# Patient Record
Sex: Male | Born: 1970 | Hispanic: No | Marital: Married | Smoking: Light tobacco smoker
Health system: Southern US, Community
[De-identification: ages and names within clinical notes are randomized; demographics above are authoritative.]

## PROBLEM LIST (undated history)

## (undated) DIAGNOSIS — G4733 Obstructive sleep apnea (adult) (pediatric): Secondary | ICD-10-CM

## (undated) DIAGNOSIS — R740 Nonspecific elevation of levels of transaminase and lactic acid dehydrogenase [LDH]: Secondary | ICD-10-CM

## (undated) DIAGNOSIS — E119 Type 2 diabetes mellitus without complications: Secondary | ICD-10-CM

## (undated) DIAGNOSIS — Z9989 Dependence on other enabling machines and devices: Secondary | ICD-10-CM

## (undated) DIAGNOSIS — E785 Hyperlipidemia, unspecified: Secondary | ICD-10-CM

## (undated) DIAGNOSIS — R790 Abnormal level of blood mineral: Secondary | ICD-10-CM

## (undated) HISTORY — DX: Hyperlipidemia, unspecified: E78.5

## (undated) HISTORY — DX: Nonspecific elevation of levels of transaminase and lactic acid dehydrogenase (ldh): R74.0

## (undated) HISTORY — PX: NO PAST SURGERIES: SHX2092

## (undated) HISTORY — DX: Obstructive sleep apnea (adult) (pediatric): G47.33

## (undated) HISTORY — DX: Type 2 diabetes mellitus without complications: E11.9

## (undated) HISTORY — DX: Abnormal level of blood mineral: R79.0

## (undated) HISTORY — DX: Dependence on other enabling machines and devices: Z99.89

---

## 2013-02-10 ENCOUNTER — Ambulatory Visit (INDEPENDENT_AMBULATORY_CARE_PROVIDER_SITE_OTHER): Payer: BC Managed Care – PPO | Admitting: Family Medicine

## 2013-02-10 ENCOUNTER — Encounter: Payer: Self-pay | Admitting: Family Medicine

## 2013-02-10 ENCOUNTER — Encounter (INDEPENDENT_AMBULATORY_CARE_PROVIDER_SITE_OTHER): Payer: Self-pay

## 2013-02-10 VITALS — BP 108/76 | Temp 98.3°F | Ht 72.05 in | Wt 221.0 lb

## 2013-02-10 DIAGNOSIS — Z23 Encounter for immunization: Secondary | ICD-10-CM

## 2013-02-10 DIAGNOSIS — E789 Disorder of lipoprotein metabolism, unspecified: Secondary | ICD-10-CM

## 2013-02-10 DIAGNOSIS — G4733 Obstructive sleep apnea (adult) (pediatric): Secondary | ICD-10-CM

## 2013-02-10 DIAGNOSIS — E119 Type 2 diabetes mellitus without complications: Secondary | ICD-10-CM

## 2013-02-10 LAB — LIPID PANEL
CHOL/HDL RATIO: 7
Cholesterol: 176 mg/dL (ref 0–200)
HDL: 24.9 mg/dL — ABNORMAL LOW (ref >39.00)
TRIGLYCERIDES: 870 mg/dL — AB (ref 0.0–149.0)
VLDL: 174 mg/dL — AB (ref 0.0–40.0)

## 2013-02-10 LAB — BASIC METABOLIC PANEL
BUN: 14 mg/dL (ref 6–23)
CO2: 29 mEq/L (ref 19–32)
Calcium: 9.1 mg/dL (ref 8.4–10.5)
Chloride: 99 mEq/L (ref 96–112)
Creatinine, Ser: 1.1 mg/dL (ref 0.4–1.5)
GFR: 74.63 mL/min (ref >60.00)
Glucose, Bld: 391 mg/dL — ABNORMAL HIGH (ref 70–99)
Potassium: 4.3 mEq/L (ref 3.5–5.1)
Sodium: 135 mEq/L (ref 135–145)

## 2013-02-10 LAB — MICROALBUMIN / CREATININE URINE RATIO
Creatinine,U: 108.3 mg/dL
MICROALB UR: 5.5 mg/dL — AB (ref 0.0–1.9)
Microalb Creat Ratio: 5.1 mg/g (ref 0.0–30.0)

## 2013-02-10 LAB — HEMOGLOBIN A1C: Hgb A1c MFr Bld: 9.4 % — ABNORMAL HIGH (ref 4.6–6.5)

## 2013-02-10 LAB — LDL CHOLESTEROL, DIRECT: Direct LDL: 43.5 mg/dL

## 2013-02-10 NOTE — Progress Notes (Signed)
Chief Complaint  Patient presents with  . Establish Care    HPI:  David Grant is here to establish care. Recently moved to [place].  Last PCP and physical:   Has the following chronic problems and concerns today:  OSA: -uses CPAP -has not had sleep study in over 13 years and CPAP mask is falling apart and needs new mask -report this machine is only 45 years old   Diabetes Mellitis: -reports was on metformin but this was stopped due to good control and was diet controlled after -last labs - he does not remember -he has not checked his blood sugar in over 1 year  HLD: -never on medication for this  No regular exercise  Feels like diet is good  Patient Active Problem List   Diagnosis Date Noted  . Obstructive sleep apnea 02/10/2013  . Diabetes mellitus, type 2 02/10/2013  . Lipid disorder 02/10/2013    Health Maintenance: -had flu vaccine in September -never had pneumonia -does not remember last tdap  ROS: See pertinent positives and negatives per HPI.  Past Medical History  Diagnosis Date  . Diabetes mellitus without complication   . Hyperlipidemia   . OSA on CPAP     Family History  Problem Relation Age of Onset  . Cancer Father   . Diabetes Father   . Hypertension Father     History   Social History  . Marital Status: Married    Spouse Name: N/A    Number of Children: N/A  . Years of Education: N/A   Social History Main Topics  . Smoking status: Light Tobacco Smoker  . Smokeless tobacco: None     Comment:  1-2 cig per week  . Alcohol Use: Yes     Comment: rarely  . Drug Use: No  . Sexual Activity: None   Other Topics Concern  . 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  EXAMCeasar Mons Vitals:   02/10/13 0928  BP:  108/76  Temp: 98.3 F (36.8 C)    Body mass index is 29.93 kg/(m^2).  GENERAL: vitals reviewed and listed above, alert, oriented, appears well hydrated and in no acute distress  HEENT: atraumatic, conjunttiva clear, no obvious abnormalities on inspection of external nose and ears  NECK: no obvious masses on inspection  LUNGS: clear to auscultation bilaterally, no wheezes, rales or rhonchi, good air movement  CV: HRRR, no peripheral edema  MS: moves all extremities without noticeable abnormality  PSYCH: pleasant and cooperative, no obvious depression or anxiety  ASSESSMENT AND PLAN:  Discussed the following assessment and plan:  Diabetes mellitus, type 2 - Plan: Hemoglobin A1c, Basic metabolic panel, Microalbumin/Creatinine Ratio, Urine  Lipid disorder - Plan: Lipid Panel  Obstructive sleep apnea - Plan: Nocturnal polysomnography (NPSG)  Need for prophylactic vaccination with combined diphtheria-tetanus-pertussis (DTP) vaccine - Plan: Tdap vaccine greater than or equal to 7yo IM  Need for prophylactic vaccination and inoculation against varicella - Plan: Pneumococcal polysaccharide vaccine 23-valent greater than or equal to 2yo subcutaneous/IM, Pneumococcal polysaccharide vaccine 23-valent greater than or equal to 2yo subcutaneous/IM  -We reviewed the PMH, PSH, FH, SH, Meds and Allergies. -We provided refills for any  medications we will prescribe as needed. -We addressed current concerns per orders and patient instructions. -We have asked for records for pertinent exams, studies, vaccines and notes from previous providers. -We have advised patient to follow up per instructions below. -NON-FASTING labs today -tdap and pneumvax today -aksed him to have cpap company fax us orders for new mask and advised sleep study to reeval but he refused reeval at this time -advised healthy diet, no smoking, regular exercise -follow up 1 month   -Patient advised to return or notify a  doctor immediately if symptoms worsen or persist or new concerns arise.  Patient Instructions  -We have ordered labs or studies at this visit. It can take up to 1-2 weeks for results and processing. We will contact you with instructions IF your results are abnormal. Normal results will be released to your [facility]. If you have not heard from us or can not find your results in [facility] in 2 weeks please contact our office.  -PLEASE SIGN UP FOR MYCHART TODAY   We recommend the following healthy lifestyle measures: - eat a healthy diet consisting of lots of vegetables, fruits, beans, nuts, seeds, healthy meats such as white chicken and fish and whole grains.  - avoid fried foods, fast food, processed foods, sodas, red meet and other fattening foods.  - get a least 150 minutes of aerobic exercise per week.   Follow up in: 1 month      Mullany, HANNAH R.

## 2013-02-10 NOTE — Patient Instructions (Signed)
-  We have ordered labs or studies at this visit. It can take up to 1-2 weeks for results and processing. We will contact you with instructions IF your results are abnormal. Normal results will be released to your MYCHART. If you have not heard from us or can not find your results in MYCHART in 2 weeks please contact our office.  -PLEASE SIGN UP FOR MYCHART TODAY   We recommend the following healthy lifestyle measures: - eat a healthy diet consisting of lots of vegetables, fruits, beans, nuts, seeds, healthy meats such as white chicken and fish and whole grains.  - avoid fried foods, fast food, processed foods, sodas, red meet and other fattening foods.  - get a least 150 minutes of aerobic exercise per week.   Follow up in: 1 month  

## 2013-02-10 NOTE — Progress Notes (Signed)
Pre visit review using our clinic review tool, if applicable. No additional management support is needed unless otherwise documented below in the visit note. 

## 2013-02-11 MED ORDER — METFORMIN HCL 500 MG PO TABS: ORAL_TABLET | ORAL | Status: DC

## 2013-02-11 MED ORDER — PRAVASTATIN SODIUM 40 MG PO TABS: 40.0000 mg | ORAL_TABLET | Freq: Every day | ORAL | Status: DC

## 2013-02-11 NOTE — Addendum Note (Signed)
Addended by: Azucena FreedMILLNER, Kasidee Voisin C on: 02/11/2013 02:58 PM   Modules accepted: Orders

## 2013-02-14 NOTE — Addendum Note (Signed)
Addended by: Azucena FreedMILLNER, Sherrol Vicars C on: 02/14/2013 03:29 PM   Modules accepted: Orders

## 2013-03-18 ENCOUNTER — Ambulatory Visit (HOSPITAL_BASED_OUTPATIENT_CLINIC_OR_DEPARTMENT_OTHER): Payer: BC Managed Care – PPO | Attending: Family Medicine | Admitting: Radiology

## 2013-03-18 VITALS — Ht 74.0 in | Wt 230.0 lb

## 2013-03-18 DIAGNOSIS — Z9989 Dependence on other enabling machines and devices: Secondary | ICD-10-CM

## 2013-03-18 DIAGNOSIS — G4733 Obstructive sleep apnea (adult) (pediatric): Secondary | ICD-10-CM | POA: Insufficient documentation

## 2013-03-31 ENCOUNTER — Other Ambulatory Visit: Payer: Self-pay | Admitting: Family Medicine

## 2013-04-01 DIAGNOSIS — G4730 Sleep apnea, unspecified: Secondary | ICD-10-CM

## 2013-04-01 DIAGNOSIS — G4710 Hypersomnia, unspecified: Secondary | ICD-10-CM

## 2013-04-01 DIAGNOSIS — G4733 Obstructive sleep apnea (adult) (pediatric): Secondary | ICD-10-CM

## 2013-04-01 NOTE — Sleep Study (Signed)
   NAME: David Grant DATE OF BIRTH:  30-May-1970 MEDICAL RECORD NUMBER [id number]  LOCATION: [facility]  PHYSICIAN: Barbaraann ShareCLANCE,[name]  DATE OF STUDY: 03/18/2013  SLEEP STUDY TYPE: Nocturnal Polysomnogram               REFERRING PHYSICIAN: Terressa KoyanagiKim, Hannah R., DO  INDICATION FOR STUDY: Hypersomnia with sleep apnea  EPWORTH SLEEPINESS SCORE:  5 HEIGHT: 6\' 2"  (188 cm)  WEIGHT: 230 lb (104.327 kg)    Body mass index is 29.52 kg/(m^2).  NECK SIZE: 16 in.  MEDICATIONS: Reviewed in sleep chart  SLEEP ARCHITECTURE: The patient had a total sleep time of 364 minutes with no slow-wave sleep and decreased quantity of REM.  Sleep onset latency was normal at 3 minutes, and REM onset was normal at 114 minutes. Sleep efficiency was 92% during the diagnostic portion of the study, and 96% during the titration portion.  RESPIRATORY DATA: The patient underwent a split night protocol where he was found to have 143 central and obstructive events in the first 138 minutes of sleep. This gave him an AHI during the diagnostic portion of the study of 62 events per hour. The events occurred all in the supine position, and there was loud snoring noted throughout. By protocol, the patient was fitted with a standard ResMed air fit N10 nasal mask, and CPAP titration was initiated. The patient was found to have an optimal pressure of 7 cm of water, with good control of his obstructive events even during supine REM.  OXYGEN DATA: There was oxygen desaturation transiently as low as 81% with the patient's obstructive events.  CARDIAC DATA: No clinically significant arrhythmias were noted.  MOVEMENT/PARASOMNIA: The patient had no significant limb movements or other abnormal behaviors noted during the night.  IMPRESSION/ RECOMMENDATION:    1) Split-night study reveals severe obstructive sleep apnea, with an AHI of 62 events per hour during the diagnostic portion of the study. The patient was then fitted with a  standard ResMed air fit N10 nasal mask, and titrated to an optimal pressure of 7 cm of water with good control of his obstructive events even thru supine REM.  He should also be encouraged to work on weight loss.   Barbaraann ShareLANCE,[name] Diplomate, American Board of Sleep Medicine  ELECTRONICALLY SIGNED ON:  04/01/2013, 11:37 AM [facility] PH: [phone]   FX: [phone] ACCREDITED BY THE AMERICAN ACADEMY OF SLEEP MEDICINE

## 2013-04-02 MED ORDER — PRAVASTATIN SODIUM 40 MG PO TABS: 40.0000 mg | ORAL_TABLET | Freq: Every day | ORAL | Status: DC

## 2013-04-02 MED ORDER — METFORMIN HCL 1000 MG PO TABS: 1000.0000 mg | ORAL_TABLET | Freq: Two times a day (BID) | ORAL | Status: DC

## 2013-04-09 ENCOUNTER — Encounter: Payer: Self-pay | Admitting: Family Medicine

## 2013-04-09 ENCOUNTER — Ambulatory Visit (INDEPENDENT_AMBULATORY_CARE_PROVIDER_SITE_OTHER): Payer: BC Managed Care – PPO | Admitting: Family Medicine

## 2013-04-09 ENCOUNTER — Telehealth: Payer: Self-pay | Admitting: Family Medicine

## 2013-04-09 VITALS — BP 110/70 | Temp 98.6°F | Wt 222.0 lb

## 2013-04-09 DIAGNOSIS — E1129 Type 2 diabetes mellitus with other diabetic kidney complication: Secondary | ICD-10-CM

## 2013-04-09 DIAGNOSIS — E1165 Type 2 diabetes mellitus with hyperglycemia: Principal | ICD-10-CM

## 2013-04-09 DIAGNOSIS — G4733 Obstructive sleep apnea (adult) (pediatric): Secondary | ICD-10-CM

## 2013-04-09 DIAGNOSIS — E789 Disorder of lipoprotein metabolism, unspecified: Secondary | ICD-10-CM

## 2013-04-09 DIAGNOSIS — IMO0002 Reserved for concepts with insufficient information to code with codable children: Secondary | ICD-10-CM

## 2013-04-09 MED ORDER — GLUCOSE BLOOD VI STRP: ORAL_STRIP | Status: DC

## 2013-04-09 MED ORDER — LISINOPRIL 2.5 MG PO TABS: 2.5000 mg | ORAL_TABLET | Freq: Every day | ORAL | Status: DC

## 2013-04-09 MED ORDER — ACCU-CHEK FASTCLIX LANCETS MISC: Status: DC

## 2013-04-09 NOTE — Progress Notes (Signed)
Chief Complaint  Patient presents with  . Follow-up    HPI:  Follow up:  Diabetes Mellitis: -uncontrolled, pt refused diabetes education -reports: rarely checking sugar -denies: CP, SOB, swelling, diarrhea -meds: metformin 1000mg  bid -home blood sugar: unknown -eye exam: not done  Hyperlipidemia: -advised statin -he takes this daily  OSA: -had sleep study -using nightly  ROS: See pertinent positives and negatives per HPI.  Past Medical History  Diagnosis Date  . Diabetes mellitus without complication   . Hyperlipidemia   . OSA on CPAP   . Type 2 diabetes, uncontrolled, with renal manifestation 04/09/2013    No past surgical history on file.  Family History  Problem Relation Age of Onset  . Cancer Father   . Diabetes Father   . Hypertension Father     History   Social History  . Marital Status: Married    Spouse Name: N/A    Number of Children: N/A  . Years of Education: N/A   Social History Main Topics  . Smoking status: Light Tobacco Smoker  . Smokeless tobacco: None     Comment:  1-2 cig per week  . Alcohol Use: Yes     Comment: rarely  . Drug Use: No  . Sexual Activity: None   Other Topics Concern  . None   Social History Narrative   Work or School: Publishing copyresearch statistic instructor      Home Situation: lives with wife and child      Spiritual Beliefs: Christian      Lifestyle: no exercise, diet ok             Current outpatient prescriptions:aspirin 81 MG tablet, Take 81 mg by mouth daily., Disp: , Rfl: ;  lisinopril (ZESTRIL) 2.5 MG tablet, Take 1 tablet (2.5 mg total) by mouth daily., Disp: 90 tablet, Rfl: 0;  metFORMIN (GLUCOPHAGE) 1000 MG tablet, Take 1 tablet (1,000 mg total) by mouth 2 (two) times daily with a meal., Disp: 180 tablet, Rfl: 1;  pravastatin (PRAVACHOL) 40 MG tablet, Take 1 tablet (40 mg total) by mouth daily., Disp: 90 tablet, Rfl: 1  EXAM:  Filed Vitals:   04/09/13 0807  BP: 110/70  Temp: 98.6 F (37 C)     Body mass index is 28.49 kg/(m^2).  GENERAL: vitals reviewed and listed above, alert, oriented, appears well hydrated and in no acute distress  HEENT: atraumatic, conjunttiva clear, no obvious abnormalities on inspection of external nose and ears  NECK: no obvious masses on inspection  LUNGS: clear to auscultation bilaterally, no wheezes, rales or rhonchi, good air movement  CV: HRRR, no peripheral edema  MS: moves all extremities without noticeable abnormality  PSYCH: pleasant and cooperative, no obvious depression or anxiety  ASSESSMENT AND PLAN:  Discussed the following assessment and plan:  Type 2 diabetes, uncontrolled, with renal manifestation - Plan: lisinopril (ZESTRIL) 2.5 MG tablet, Ambulatory referral to Nutrition and Diabetic Education  Lipid disorder  Obstructive sleep apnea  -low dose acei and baby aspirin advised -advised lifestyle recs and diabetes education and advised of implications, course, progression, complication, management of diabetes -advised per instructions - call in 1 week -follow up in 2 months and check labs then -advised eye exam -Patient advised to return or notify a doctor immediately if symptoms worsen or persist or new concerns arise.  Patient Instructions  -We placed a referral for you as discussed to the diabetes educator. It usually takes about 1-2 weeks to process and schedule this referral. If you have  not heard from [place] regarding this appointment in 2 weeks please contact our office.  -Please check blood sugar twice a day and if you ever feel unwell: 1)FASTING (in the morning before you eat) 2) POSTPRANDIAL (2 hours after a meal) Please keep a written log of your blood sugars and bring to every appointment and call in 1 week with blood sugar values  -start the lisinopril  We recommend the following healthy lifestyle measures: - eat a healthy diet consisting of lots of vegetables, fruits, beans, nuts, seeds, healthy meats such  as white chicken and fish and whole grains.  - avoid fried foods, fast food, processed foods, sodas, red meet and other fattening foods.  - get a least 150 minutes of aerobic exercise per week.   Follow up in 1-2 months     Maffett, HANNAH R.

## 2013-04-09 NOTE — Telephone Encounter (Signed)
Relevant patient education mailed to patient.  

## 2013-04-09 NOTE — Patient Instructions (Signed)
-  We placed a referral for you as discussed to the diabetes educator. It usually takes about 1-2 weeks to process and schedule this referral. If you have not heard from us regarding this appointment in 2 weeks please contact our office.  -Please check blood sugar twice a day and if you ever feel unwell: 1)FASTING (in the morning before you eat) 2) POSTPRANDIAL (2 hours after a meal) Please keep a written log of your blood sugars and bring to every appointment and call in 1 week with blood sugar values  -start the lisinopril  We recommend the following healthy lifestyle measures: - eat a healthy diet consisting of lots of vegetables, fruits, beans, nuts, seeds, healthy meats such as white chicken and fish and whole grains.  - avoid fried foods, fast food, processed foods, sodas, red meet and other fattening foods.  - get a least 150 minutes of aerobic exercise per week.   Follow up in 1-2 months

## 2013-04-09 NOTE — Progress Notes (Signed)
Pre visit review using our clinic review tool, if applicable. No additional management support is needed unless otherwise documented below in the visit note. 

## 2013-04-09 NOTE — Addendum Note (Signed)
Addended by: Azucena FreedMILLNER, Dyson Sevey C on: 04/09/2013 10:41 AM   Modules accepted: Orders

## 2013-04-10 ENCOUNTER — Ambulatory Visit: Payer: BC Managed Care – PPO | Admitting: Family Medicine

## 2013-06-10 ENCOUNTER — Ambulatory Visit: Payer: BC Managed Care – PPO | Admitting: Family Medicine

## 2013-06-11 ENCOUNTER — Telehealth: Payer: Self-pay | Admitting: Family Medicine

## 2013-06-11 ENCOUNTER — Ambulatory Visit (INDEPENDENT_AMBULATORY_CARE_PROVIDER_SITE_OTHER): Payer: BC Managed Care – PPO | Admitting: Family Medicine

## 2013-06-11 ENCOUNTER — Encounter: Payer: Self-pay | Admitting: Family Medicine

## 2013-06-11 VITALS — BP 112/82 | HR 89 | Temp 99.1°F | Ht 74.0 in | Wt 219.0 lb

## 2013-06-11 DIAGNOSIS — IMO0001 Reserved for inherently not codable concepts without codable children: Secondary | ICD-10-CM

## 2013-06-11 DIAGNOSIS — G4733 Obstructive sleep apnea (adult) (pediatric): Secondary | ICD-10-CM

## 2013-06-11 DIAGNOSIS — E785 Hyperlipidemia, unspecified: Secondary | ICD-10-CM

## 2013-06-11 DIAGNOSIS — IMO0002 Reserved for concepts with insufficient information to code with codable children: Secondary | ICD-10-CM

## 2013-06-11 DIAGNOSIS — E1129 Type 2 diabetes mellitus with other diabetic kidney complication: Secondary | ICD-10-CM

## 2013-06-11 DIAGNOSIS — E1165 Type 2 diabetes mellitus with hyperglycemia: Secondary | ICD-10-CM

## 2013-06-11 LAB — BASIC METABOLIC PANEL
BUN: 14 mg/dL (ref 6–23)
CHLORIDE: 100 meq/L (ref 96–112)
CO2: 29 meq/L (ref 19–32)
Calcium: 9.6 mg/dL (ref 8.4–10.5)
Creatinine, Ser: 1.1 mg/dL (ref 0.4–1.5)
GFR: 79.36 mL/min (ref >60.00)
GLUCOSE: 117 mg/dL — AB (ref 70–99)
POTASSIUM: 4.3 meq/L (ref 3.5–5.1)
Sodium: 138 mEq/L (ref 135–145)

## 2013-06-11 LAB — LIPID PANEL
CHOLESTEROL: 117 mg/dL (ref 0–200)
HDL: 31.8 mg/dL — ABNORMAL LOW (ref >39.00)
LDL CALC: 27 mg/dL (ref 0–99)
Total CHOL/HDL Ratio: 4
Triglycerides: 293 mg/dL — ABNORMAL HIGH (ref 0.0–149.0)
VLDL: 58.6 mg/dL — AB (ref 0.0–40.0)

## 2013-06-11 LAB — MICROALBUMIN / CREATININE URINE RATIO
Creatinine,U: 210.6 mg/dL
MICROALB UR: 7.4 mg/dL — AB (ref 0.0–1.9)
Microalb Creat Ratio: 3.5 mg/g (ref 0.0–30.0)

## 2013-06-11 LAB — HEMOGLOBIN A1C: Hgb A1c MFr Bld: 7.3 % — ABNORMAL HIGH (ref 4.6–6.5)

## 2013-06-11 MED ORDER — LISINOPRIL 2.5 MG PO TABS: 2.5000 mg | ORAL_TABLET | Freq: Every day | ORAL | Status: DC

## 2013-06-11 MED ORDER — PRAVASTATIN SODIUM 40 MG PO TABS
40.0000 mg | ORAL_TABLET | Freq: Every day | ORAL | Status: DC
Start: 2013-06-11 — End: 2013-10-06

## 2013-06-11 MED ORDER — METFORMIN HCL 1000 MG PO TABS: 1000.0000 mg | ORAL_TABLET | Freq: Two times a day (BID) | ORAL | Status: DC

## 2013-06-11 NOTE — Progress Notes (Signed)
Pre visit review using our clinic review tool, if applicable. No additional management support is needed unless otherwise documented below in the visit note. 

## 2013-06-11 NOTE — Progress Notes (Signed)
No chief complaint on file.   HPI:  Follow up: Note: new pt with uncontrolled diabetes, not compliant with treatment recommendations.  DM: -started metformin and advised to see diabetes educator and call with home BS - he did not follow these instruction -started acei and asa -reports: not really checking his bs, when check is 120-130 in morning fasting; trying to eat vegetables, trying to limit carbs; no regular exercise -he reports: taking the metformin most days, lisinopril and asa -denies:  HLD: -started pravastatin  OSA: -using CPAP  ROS: See pertinent positives and negatives per HPI.  Past Medical History  Diagnosis Date  . Diabetes mellitus without complication   . Hyperlipidemia   . OSA on CPAP   . Type 2 diabetes, uncontrolled, with renal manifestation 04/09/2013    No past surgical history on file.  Family History  Problem Relation Age of Onset  . Cancer Father   . Diabetes Father   . Hypertension Father     History   Social History  . Marital Status: Married    Spouse Name: N/A    Number of Children: N/A  . Years of Education: N/A   Social History Main Topics  . Smoking status: Light Tobacco Smoker  . Smokeless tobacco: None     Comment:  1-2 cig per week  . Alcohol Use: Yes     Comment: rarely  . Drug Use: No  . Sexual Activity: None   Other Topics Concern  . None   Social History Narrative   Work or School: Publishing copyresearch statistic instructor      Home Situation: lives with wife and child      Spiritual Beliefs: Christian      Lifestyle: no exercise, diet ok             Current outpatient prescriptions:ACCU-CHEK FASTCLIX LANCETS MISC, Test twice daily., Disp: 102 each, Rfl: 5;  aspirin 81 MG tablet, Take 81 mg by mouth daily., Disp: , Rfl: ;  glucose blood (ACCU-CHEK SMARTVIEW) test strip, Test twice daily., Disp: 100 each, Rfl: 5;  lisinopril (ZESTRIL) 2.5 MG tablet, Take 1 tablet (2.5 mg total) by mouth daily., Disp: 90 tablet, Rfl:  1 metFORMIN (GLUCOPHAGE) 1000 MG tablet, Take 1 tablet (1,000 mg total) by mouth 2 (two) times daily with a meal., Disp: 180 tablet, Rfl: 1;  pravastatin (PRAVACHOL) 40 MG tablet, Take 1 tablet (40 mg total) by mouth daily., Disp: 90 tablet, Rfl: 1  EXAM:  Filed Vitals:   06/11/13 1013  BP: 112/82  Pulse: 89  Temp: 99.1 F (37.3 C)    Body mass index is 28.11 kg/(m^2).  GENERAL: vitals reviewed and listed above, alert, oriented, appears well hydrated and in no acute distress  HEENT: atraumatic, conjunttiva clear, no obvious abnormalities on inspection of external nose and ears  NECK: no obvious masses on inspection  LUNGS: clear to auscultation bilaterally, no wheezes, rales or rhonchi, good air movement  CV: HRRR, no peripheral edema  MS: moves all extremities without noticeable abnormality  PSYCH: pleasant and cooperative, no obvious depression or anxiety  ASSESSMENT AND PLAN:  Discussed the following assessment and plan:  Diabetes mellitus type II, uncontrolled - Plan: Hemoglobin A1c, Basic metabolic panel, Microalbumin/Creatinine Ratio, Urine  Hyperlipemia - Plan: Lipid panel, pravastatin (PRAVACHOL) 40 MG tablet  Obstructive sleep apnea  Type 2 diabetes, uncontrolled, with renal manifestation - Plan: metFORMIN (GLUCOPHAGE) 1000 MG tablet, lisinopril (ZESTRIL) 2.5 MG tablet  -discussed implications and complications of uncontrolled diabetes and advised  if not following treatment recs and improving sig over next 3 months will have him see endocrinologist -foot exam today, labs today then adjust medications - add oral med as he does not want to do insulin now - discuss options and likely will add [place] -Patient advised to return or notify a doctor immediately if symptoms worsen or persist or new concerns arise.  Patient Instructions   CALL TODAY TO SCHEDULE APPOINTMENT WITH DIABETES EDUCATOR  -Please check blood sugar twice a day and if you ever feel unwell:   1)FASTING (in the morning before you eat)  2) POSTPRANDIAL (2 hours after a meal)  Please keep a written log of your blood sugars and bring to every appointment and call in 1 week with blood sugar values and bring to your appointments  We recommend the following healthy lifestyle measures:  - eat a healthy low carbohydrate  diet consisting of lots of vegetables, fruits, beans, nuts, seeds, healthy meats such as white chicken and fish  - avoid rice, bread, sweets, potatoes, fried foods, fast food, processed foods, sodas, red meet and other fattening foods.  - get a least 150 minutes of aerobic exercise per week.   -We have ordered labs or studies at this visit. It can take up to 1-2 weeks for results and processing. We will contact you with instructions IF your results are abnormal. Normal results will be released to your [facility]. If you have not heard from [place] or can not find your results in [facility] in 2 weeks please contact our office.   Follow up in 3 months         Terressa KoyanagiHannah R. Hulet

## 2013-06-11 NOTE — Patient Instructions (Signed)
  CALL TODAY TO SCHEDULE APPOINTMENT WITH DIABETES EDUCATOR  -Please check blood sugar twice a day and if you ever feel unwell:  1)FASTING (in the morning before you eat)  2) POSTPRANDIAL (2 hours after a meal)  Please keep a written log of your blood sugars and bring to every appointment and call in 1 week with blood sugar values and bring to your appointments  We recommend the following healthy lifestyle measures:  - eat a healthy low carbohydrate  diet consisting of lots of vegetables, fruits, beans, nuts, seeds, healthy meats such as white chicken and fish  - avoid rice, bread, sweets, potatoes, fried foods, fast food, processed foods, sodas, red meet and other fattening foods.  - get a least 150 minutes of aerobic exercise per week.   -We have ordered labs or studies at this visit. It can take up to 1-2 weeks for results and processing. We will contact you with instructions IF your results are abnormal. Normal results will be released to your [facility]. If you have not heard from us or can not find your results in [facility] in 2 weeks please contact our office.   Follow up in 3 months

## 2013-06-11 NOTE — Telephone Encounter (Signed)
Relevant patient education assigned to patient using Emmi. ° °

## 2013-06-12 ENCOUNTER — Other Ambulatory Visit: Payer: Self-pay | Admitting: *Deleted

## 2013-06-12 MED ORDER — SITAGLIPTIN PHOSPHATE 50 MG PO TABS: 50.0000 mg | ORAL_TABLET | Freq: Every day | ORAL | Status: DC

## 2013-06-12 NOTE — Telephone Encounter (Signed)
I spoke with the pt and he is aware Januvia sent to pharmacy.

## 2013-06-18 ENCOUNTER — Telehealth: Payer: Self-pay

## 2013-06-18 NOTE — Telephone Encounter (Signed)
Relevant patient education assigned to patient using Emmi. ° °

## 2013-09-01 ENCOUNTER — Encounter: Payer: Self-pay | Admitting: Family Medicine

## 2013-09-08 ENCOUNTER — Encounter: Payer: Self-pay | Admitting: Family Medicine

## 2013-09-08 ENCOUNTER — Encounter: Payer: BC Managed Care – PPO | Admitting: Family Medicine

## 2013-09-08 ENCOUNTER — Ambulatory Visit (INDEPENDENT_AMBULATORY_CARE_PROVIDER_SITE_OTHER): Payer: BC Managed Care – PPO | Admitting: Family Medicine

## 2013-09-08 VITALS — BP 120/84 | HR 77 | Temp 99.0°F | Ht 74.0 in | Wt 225.5 lb

## 2013-09-08 DIAGNOSIS — E789 Disorder of lipoprotein metabolism, unspecified: Secondary | ICD-10-CM

## 2013-09-08 DIAGNOSIS — R7402 Elevation of levels of lactic acid dehydrogenase (LDH): Secondary | ICD-10-CM

## 2013-09-08 DIAGNOSIS — E1129 Type 2 diabetes mellitus with other diabetic kidney complication: Secondary | ICD-10-CM

## 2013-09-08 DIAGNOSIS — R740 Nonspecific elevation of levels of transaminase and lactic acid dehydrogenase [LDH]: Secondary | ICD-10-CM

## 2013-09-08 DIAGNOSIS — IMO0002 Reserved for concepts with insufficient information to code with codable children: Secondary | ICD-10-CM

## 2013-09-08 DIAGNOSIS — R7401 Elevation of levels of liver transaminase levels: Secondary | ICD-10-CM

## 2013-09-08 DIAGNOSIS — E1165 Type 2 diabetes mellitus with hyperglycemia: Secondary | ICD-10-CM

## 2013-09-08 DIAGNOSIS — E785 Hyperlipidemia, unspecified: Secondary | ICD-10-CM

## 2013-09-08 DIAGNOSIS — R7309 Other abnormal glucose: Secondary | ICD-10-CM

## 2013-09-08 LAB — BASIC METABOLIC PANEL
BUN: 11 mg/dL (ref 6–23)
CALCIUM: 9.2 mg/dL (ref 8.4–10.5)
CO2: 27 meq/L (ref 19–32)
CREATININE: 1 mg/dL (ref 0.4–1.5)
Chloride: 99 mEq/L (ref 96–112)
GFR: 90.94 mL/min (ref >60.00)
Glucose, Bld: 112 mg/dL — ABNORMAL HIGH (ref 70–99)
Potassium: 3.9 mEq/L (ref 3.5–5.1)
Sodium: 136 mEq/L (ref 135–145)

## 2013-09-08 LAB — MICROALBUMIN / CREATININE URINE RATIO
Creatinine,U: 202.8 mg/dL
MICROALB UR: 2 mg/dL — AB (ref 0.0–1.9)
Microalb Creat Ratio: 1 mg/g (ref 0.0–30.0)

## 2013-09-08 LAB — LIPID PANEL
CHOL/HDL RATIO: 4
Cholesterol: 110 mg/dL (ref 0–200)
HDL: 28.8 mg/dL — ABNORMAL LOW (ref >39.00)
NONHDL: 81.2
TRIGLYCERIDES: 215 mg/dL — AB (ref 0.0–149.0)
VLDL: 43 mg/dL — ABNORMAL HIGH (ref 0.0–40.0)

## 2013-09-08 LAB — HEMOGLOBIN A1C: Hgb A1c MFr Bld: 7.2 % — ABNORMAL HIGH (ref 4.6–6.5)

## 2013-09-08 LAB — LDL CHOLESTEROL, DIRECT: Direct LDL: 55.5 mg/dL

## 2013-09-08 NOTE — Progress Notes (Signed)
Error   This encounter was created in error - please disregard. 

## 2013-09-08 NOTE — Progress Notes (Addendum)
No chief complaint on file.   HPI:   Follow up: Note: new pt with uncontrolled diabetes, not compliant initially with treatment recommendations, now improving. He reports he had physical in [place] in June 2015 and had Hep A and mild liver enzymes elevation. Normal [place] and MRI per his report. He reports he was consuming quite a bit of alcohol when these labs were done.  T. Bili 1.43, d. Bili 0.43, I. Bili 1.00, AST 106, ALT 122, GTP 104, ALk phos 76. HBsAg neg, HBsab pos, HCV Ab neg, Hep A Ab 7.6 He also had lipids, tft, CBC, bmp, a-FP, CEA, CA 19-9, PSA  Transaminitis: -on screening labs in [place], was consuming a large amount of alcohol -hep A ab + -reports no alcohol in the last month -reports normal [place] of liver - but report is in [place] -denies: Abd pain, vomiting, nausea, fevers, jaundice  DM: -started metformin 04/2013 and advised to see diabetes educator and call with home BS - he did not follow these instruction -started acei and asa -reports: checking BS, when check is 110-130 in morning fasting; trying to eat vegetables, trying to limit carbs; no regular exercise -he reports: taking the metformin most days, lisinopril and asa -denies:polyuria, polydipsia, vision changes, wounds on feet  HLD: -started pravastatin 04/2013 -stable -denies: leg cramps  OSA: -using CPAP -denies apneic spells, lethargy, daytime somnelance -has  ROS: See pertinent positives and negatives per HPI.  Past Medical History  Diagnosis Date  . Diabetes mellitus without complication   . Hyperlipidemia   . OSA on CPAP   . Type 2 diabetes, uncontrolled, with renal manifestation 04/09/2013    No past surgical history on file.  Family History  Problem Relation Age of Onset  . Cancer Father   . Diabetes Father   . Hypertension Father     History   Social History  . Marital Status: Married    Spouse Name: N/A    Number of Children: N/A  . Years of Education: N/A   Social History Main  Topics  . Smoking status: Light Tobacco Smoker  . Smokeless tobacco: None     Comment:  1-2 cig per week  . Alcohol Use: Yes     Comment: rarely  . Drug Use: No  . Sexual Activity: None   Other Topics Concern  . 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 child      Spiritual Beliefs: Christian      Lifestyle: no exercise, diet ok             Current outpatient prescriptions:ACCU-CHEK FASTCLIX LANCETS MISC, Test twice daily., Disp: 102 each, Rfl: 5;  aspirin 81 MG tablet, Take 81 mg by mouth daily., Disp: , Rfl: ;  glucose blood (ACCU-CHEK SMARTVIEW) test strip, Test twice daily., Disp: 100 each, Rfl: 5;  lisinopril (ZESTRIL) 2.5 MG tablet, Take 1 tablet (2.5 mg total) by mouth daily., Disp: 90 tablet, Rfl: 1 metFORMIN (GLUCOPHAGE) 1000 MG tablet, Take 1 tablet (1,000 mg total) by mouth 2 (two) times daily with a meal., Disp: 180 tablet, Rfl: 1;  pravastatin (PRAVACHOL) 40 MG tablet, Take 1 tablet (40 mg total) by mouth daily., Disp: 90 tablet, Rfl: 1;  sitaGLIPtin (JANUVIA) 50 MG tablet, Take 1 tablet (50 mg total) by mouth daily., Disp: 90 tablet, Rfl: 1  EXAM:  Filed Vitals:   09/08/13 1546  BP: 120/84  Pulse: 77  Temp: 99 F (37.2 C)    Body mass index is 28.94 kg/(m^2).  GENERAL: vitals reviewed and listed above, alert, oriented, appears well hydrated and in no acute distress  HEENT: atraumatic, conjunttiva clear, no obvious abnormalities on inspection of external nose and ears  NECK: no obvious masses on inspection  LUNGS: clear to auscultation bilaterally, no wheezes, rales or rhonchi, good air movement  CV: HRRR, no peripheral edema  MS: moves all extremities without noticeable abnormality  PSYCH: pleasant and cooperative, no obvious depression or anxiety  ASSESSMENT AND PLAN:  Discussed the following assessment and plan:  Other and unspecified hyperlipidemia - Plan: Lipid  panel  Other abnormal glucose - Plan: Basic metabolic panel, Hemoglobin A1c, Microalbumin / creatinine urine ratio  Transaminitis - Plan: Hepatic Function Panel, Hepatitis B Core AB, Total, Hepatitis B surface antigen  Type 2 diabetes, uncontrolled, with renal manifestation  Lipid disorder  -stressed importance of improving diet and exercise vs adding another medication - he opted for lifestyle change and close follow up -discussed labs, orders per above -discussed course of Hep A, risks, etc -advised NO alcohol or tylenol or other liver toxic substances -assistant to contact [facility] regarding cpap -Patient advised to return or notify a doctor immediately if symptoms worsen or persist or new concerns arise.  Patient Instructions  -increase exercise and improve diet  -We have ordered labs or studies at this visit. It can take up to 1-2 weeks for results and processing. We will contact you with instructions IF your results are abnormal. Normal results will be released to your [facility]. If you have not heard from [place] or can not find your results in [facility] in 2 weeks please contact our office.  -follow up in 3 months            Strohmeier, Makinlee Awwad R.

## 2013-09-08 NOTE — Patient Instructions (Signed)
-  increase exercise and improve diet  -We have ordered labs or studies at this visit. It can take up to 1-2 weeks for results and processing. We will contact you with instructions IF your results are abnormal. Normal results will be released to your [facility]. If you have not heard from us or can not find your results in [facility] in 2 weeks please contact our office.  -follow up in 3 months

## 2013-09-08 NOTE — Progress Notes (Signed)
Pre visit review using our clinic review tool, if applicable. No additional management support is needed unless otherwise documented below in the visit note. 

## 2013-09-09 ENCOUNTER — Ambulatory Visit: Payer: BC Managed Care – PPO

## 2013-09-09 DIAGNOSIS — R740 Nonspecific elevation of levels of transaminase and lactic acid dehydrogenase [LDH]: Principal | ICD-10-CM

## 2013-09-09 DIAGNOSIS — R7402 Elevation of levels of lactic acid dehydrogenase (LDH): Secondary | ICD-10-CM

## 2013-09-09 LAB — HEPATIC FUNCTION PANEL
ALK PHOS: 64 U/L (ref 39–117)
ALT: 114 U/L — ABNORMAL HIGH (ref 0–53)
AST: 63 U/L — ABNORMAL HIGH (ref 0–37)
Albumin: 4.6 g/dL (ref 3.5–5.2)
BILIRUBIN DIRECT: 0.1 mg/dL (ref 0.0–0.3)
BILIRUBIN TOTAL: 0.6 mg/dL (ref 0.2–1.2)
TOTAL PROTEIN: 7.4 g/dL (ref 6.0–8.3)

## 2013-09-09 LAB — HEPATITIS B CORE ANTIBODY, TOTAL: HEP B C TOTAL AB: NONREACTIVE

## 2013-09-09 LAB — HEPATITIS B SURFACE ANTIGEN: Hepatitis B Surface Ag: NEGATIVE

## 2013-09-10 ENCOUNTER — Encounter: Payer: Self-pay | Admitting: Family Medicine

## 2013-09-10 ENCOUNTER — Telehealth: Payer: Self-pay | Admitting: *Deleted

## 2013-09-10 NOTE — Telephone Encounter (Signed)
09/09/2013--[phone] attn:Jenny Ruthann CancerOtt [phone](ph#[phone]).  Boneta LucksJenny called back and left a message stating she has entered the order to have the pt bring in his CPAP machine to have this checked due to this not working properly and having an odor.  She stated this will require an estimate and she is not sure if the machine will be replaced and she will call the pt to inform him of this.

## 2013-10-03 ENCOUNTER — Encounter: Payer: Self-pay | Admitting: Family Medicine

## 2013-10-03 DIAGNOSIS — E785 Hyperlipidemia, unspecified: Secondary | ICD-10-CM

## 2013-10-03 DIAGNOSIS — IMO0002 Reserved for concepts with insufficient information to code with codable children: Secondary | ICD-10-CM

## 2013-10-03 DIAGNOSIS — E1165 Type 2 diabetes mellitus with hyperglycemia: Secondary | ICD-10-CM

## 2013-10-03 DIAGNOSIS — E1129 Type 2 diabetes mellitus with other diabetic kidney complication: Secondary | ICD-10-CM

## 2013-10-06 MED ORDER — SITAGLIPTIN PHOSPHATE 50 MG PO TABS: 50.0000 mg | ORAL_TABLET | Freq: Every day | ORAL | Status: DC

## 2013-10-06 MED ORDER — PRAVASTATIN SODIUM 40 MG PO TABS: 40.0000 mg | ORAL_TABLET | Freq: Every day | ORAL | Status: DC

## 2013-10-06 MED ORDER — LISINOPRIL 2.5 MG PO TABS: 2.5000 mg | ORAL_TABLET | Freq: Every day | ORAL | Status: DC

## 2013-10-06 NOTE — Telephone Encounter (Signed)
Rxs sent to the pts pharmacy and message sent via Mychart.

## 2013-12-12 ENCOUNTER — Encounter: Payer: Self-pay | Admitting: Family Medicine

## 2013-12-12 ENCOUNTER — Ambulatory Visit (INDEPENDENT_AMBULATORY_CARE_PROVIDER_SITE_OTHER): Payer: BC Managed Care – PPO | Admitting: Family Medicine

## 2013-12-12 VITALS — BP 112/80 | HR 80 | Temp 98.4°F | Ht 74.0 in | Wt 225.4 lb

## 2013-12-12 DIAGNOSIS — E1129 Type 2 diabetes mellitus with other diabetic kidney complication: Secondary | ICD-10-CM

## 2013-12-12 DIAGNOSIS — G4733 Obstructive sleep apnea (adult) (pediatric): Secondary | ICD-10-CM

## 2013-12-12 DIAGNOSIS — R7401 Elevation of levels of liver transaminase levels: Secondary | ICD-10-CM

## 2013-12-12 DIAGNOSIS — R740 Nonspecific elevation of levels of transaminase and lactic acid dehydrogenase [LDH]: Secondary | ICD-10-CM

## 2013-12-12 DIAGNOSIS — E785 Hyperlipidemia, unspecified: Secondary | ICD-10-CM

## 2013-12-12 DIAGNOSIS — IMO0002 Reserved for concepts with insufficient information to code with codable children: Secondary | ICD-10-CM

## 2013-12-12 DIAGNOSIS — E1165 Type 2 diabetes mellitus with hyperglycemia: Secondary | ICD-10-CM

## 2013-12-12 DIAGNOSIS — K219 Gastro-esophageal reflux disease without esophagitis: Secondary | ICD-10-CM

## 2013-12-12 DIAGNOSIS — K529 Noninfective gastroenteritis and colitis, unspecified: Secondary | ICD-10-CM

## 2013-12-12 MED ORDER — METFORMIN HCL 1000 MG PO TABS: 1000.0000 mg | ORAL_TABLET | Freq: Two times a day (BID) | ORAL | Status: DC

## 2013-12-12 NOTE — Progress Notes (Signed)
Pre visit review using our clinic review tool, if applicable. No additional management support is needed unless otherwise documented below in the visit note. 

## 2013-12-12 NOTE — Patient Instructions (Signed)
prilosec 20 mg daily for 2 weeks - let me know if any stomach issues persist  -We have ordered labs or studies at this visit. It can take up to 1-2 weeks for results and processing. We will contact you with instructions IF your results are abnormal. Normal results will be released to your [facility]. If you have not heard from us or can not find your results in [facility] in 2 weeks please contact our office.

## 2013-12-12 NOTE — Addendum Note (Signed)
Addended by: Rita OharaHRASHER, Sheenah Dimitroff R on: 12/12/2013 04:21 PM   Modules accepted: Orders

## 2013-12-12 NOTE — Progress Notes (Signed)
HPI:  Follow up:  Transaminitis: -on screening labs in [place], was consuming a large amount of alcohol, improving on check here -advised avoidance of alcohol, tylenol and statin for 3 months - he did not hold the statin -hep A ab + -reports normal [place] of [place] - but [place] -was feeling fine - then had several episodes of watery diarrhea, acid reflux and nausea the last few days  DM: -meds:asa, acei, metformin 1000mg  bid, januvia 50 mg daily -started acei and asa -reports: checking BS, when check is 110-130 in morning fasting; trying to eat vegetables, trying to limit carbs; no regular exercise -he reports: he feels well -denies:polyuria, polydipsia, vision changes, wounds on feet  HLD: -started pravastatin 04/2013, hold now in setting of hep a -stable -denies: leg cramps  OSA: -using CPAP -denies apneic spells, lethargy, daytime somnelance     ROS: See pertinent positives and negatives per HPI.  Past Medical History  Diagnosis Date  . Diabetes mellitus without complication   . Hyperlipidemia   . OSA on CPAP   . Type 2 diabetes, uncontrolled, with renal manifestation 04/09/2013    No past surgical history on file.  Family History  Problem Relation Age of Onset  . Cancer Father   . Diabetes Father   . Hypertension Father     History   Social History  . Marital Status: Married    Spouse Name: N/A    Number of Children: N/A  . Years of Education: N/A   Social History Main Topics  . Smoking status: Light Tobacco Smoker  . Smokeless tobacco: None     Comment:  1-2 cig per week  . Alcohol Use: Yes     Comment: rarely  . Drug Use: No  . Sexual Activity: None   Other Topics Concern  . None   Social History Narrative   Work or School: Publishing copyresearch statistic instructor      Home Situation: lives with wife and child      Spiritual Beliefs: Christian      Lifestyle: no exercise, diet ok             Current outpatient prescriptions: ACCU-CHEK  FASTCLIX LANCETS MISC, Test twice daily., Disp: 102 each, Rfl: 5;  aspirin 81 MG tablet, Take 81 mg by mouth daily., Disp: , Rfl: ;  glucose blood (ACCU-CHEK SMARTVIEW) test strip, Test twice daily., Disp: 100 each, Rfl: 5;  lisinopril (ZESTRIL) 2.5 MG tablet, Take 1 tablet (2.5 mg total) by mouth daily., Disp: 90 tablet, Rfl: 1 metFORMIN (GLUCOPHAGE) 1000 MG tablet, Take 1 tablet (1,000 mg total) by mouth 2 (two) times daily with a meal., Disp: 180 tablet, Rfl: 1;  pravastatin (PRAVACHOL) 40 MG tablet, Take 1 tablet (40 mg total) by mouth daily., Disp: 90 tablet, Rfl: 1;  sitaGLIPtin (JANUVIA) 50 MG tablet, Take 1 tablet (50 mg total) by mouth daily., Disp: 90 tablet, Rfl: 1  EXAM:  Filed Vitals:   12/12/13 1547  BP: 112/80  Pulse: 80  Temp: 98.4 F (36.9 C)    Body mass index is 28.93 kg/(m^2).  GENERAL: vitals reviewed and listed above, alert, oriented, appears well hydrated and in no acute distress  HEENT: atraumatic, conjunttiva clear, no obvious abnormalities on inspection of external nose and ears  NECK: no obvious masses on inspection  LUNGS: clear to auscultation bilaterally, no wheezes, rales or rhonchi, good air movement  CV: HRRR, no peripheral edema  ABD: BS+, soft, NTTP  MS: moves all extremities  without noticeable abnormality  PSYCH: pleasant and cooperative, no obvious depression or anxiety  ASSESSMENT AND PLAN:  Discussed the following assessment and plan:  Transaminitis - Plan: Hepatic Function Panel  Hyperlipemia  Type 2 diabetes, uncontrolled, with renal manifestation - Plan: Basic metabolic panel, Hemoglobin A1c, metFORMIN (GLUCOPHAGE) 1000 MG tablet  Obstructive sleep apnea  Gastroesophageal reflux disease, esophagitis presence not specified  Gastroenteritis  -Patient advised to return or notify a doctor immediately if symptoms worsen or persist or new concerns arise.  Patient Instructions  prilosec 20 mg daily for 2 weeks - let me know if any  stomach issues persist  -We have ordered labs or studies at this visit. It can take up to 1-2 weeks for results and processing. We will contact you with instructions IF your results are abnormal. Normal results will be released to your [facility]. If you have not heard from us or can not find your results in [facility] in 2 weeks please contact our office.            Kriste BasqueKIM, HANNAH R.

## 2013-12-13 LAB — HEPATIC FUNCTION PANEL
ALBUMIN: 4.9 g/dL (ref 3.5–5.2)
ALT: 111 U/L — AB (ref 0–53)
AST: 62 U/L — AB (ref 0–37)
Alkaline Phosphatase: 56 U/L (ref 39–117)
Bilirubin, Direct: 0.2 mg/dL (ref 0.0–0.3)
Indirect Bilirubin: 0.5 mg/dL (ref 0.2–1.2)
TOTAL PROTEIN: 8 g/dL (ref 6.0–8.3)
Total Bilirubin: 0.7 mg/dL (ref 0.2–1.2)

## 2013-12-13 LAB — HEMOGLOBIN A1C
HEMOGLOBIN A1C: 7.1 % — AB (ref <5.7)
MEAN PLASMA GLUCOSE: 157 mg/dL — AB (ref <117)

## 2013-12-13 LAB — BASIC METABOLIC PANEL
BUN: 12 mg/dL (ref 6–23)
CHLORIDE: 99 meq/L (ref 96–112)
CO2: 24 mEq/L (ref 19–32)
Calcium: 10 mg/dL (ref 8.4–10.5)
Creat: 0.96 mg/dL (ref 0.50–1.35)
GLUCOSE: 96 mg/dL (ref 70–99)
POTASSIUM: 4.1 meq/L (ref 3.5–5.3)
Sodium: 136 mEq/L (ref 135–145)

## 2013-12-15 ENCOUNTER — Other Ambulatory Visit: Payer: Self-pay | Admitting: *Deleted

## 2013-12-15 ENCOUNTER — Telehealth: Payer: Self-pay | Admitting: Family Medicine

## 2013-12-15 DIAGNOSIS — IMO0002 Reserved for concepts with insufficient information to code with codable children: Secondary | ICD-10-CM

## 2013-12-15 DIAGNOSIS — E1129 Type 2 diabetes mellitus with other diabetic kidney complication: Secondary | ICD-10-CM

## 2013-12-15 DIAGNOSIS — E1165 Type 2 diabetes mellitus with hyperglycemia: Principal | ICD-10-CM

## 2013-12-15 MED ORDER — GLUCOSE BLOOD VI STRP: ORAL_STRIP | Status: DC

## 2013-12-15 NOTE — Addendum Note (Signed)
Addended by: Terressa KoyanagiKIM, Izora Benn R on: 12/15/2013 05:07 PM   Modules accepted: Orders

## 2013-12-15 NOTE — Telephone Encounter (Signed)
emmi emailed °

## 2013-12-15 NOTE — Telephone Encounter (Signed)
Rx sent and the pt was informed.

## 2013-12-16 ENCOUNTER — Telehealth: Payer: Self-pay | Admitting: Family Medicine

## 2013-12-16 NOTE — Telephone Encounter (Signed)
[facility] needs to know how many times pt test a day in order to fill the glucose blood (ACCU-CHEK SMARTVIEW) test strip

## 2013-12-16 NOTE — Telephone Encounter (Signed)
I called [facility] and spoke with Sarah-technician and advised her the pt needs the test strips to check his blood sugar once a day as he is not on insulin.

## 2013-12-18 ENCOUNTER — Other Ambulatory Visit: Payer: Self-pay | Admitting: *Deleted

## 2013-12-18 DIAGNOSIS — IMO0002 Reserved for concepts with insufficient information to code with codable children: Secondary | ICD-10-CM

## 2013-12-18 DIAGNOSIS — R945 Abnormal results of liver function studies: Principal | ICD-10-CM

## 2013-12-18 DIAGNOSIS — R7989 Other specified abnormal findings of blood chemistry: Secondary | ICD-10-CM

## 2013-12-18 DIAGNOSIS — E1165 Type 2 diabetes mellitus with hyperglycemia: Secondary | ICD-10-CM

## 2013-12-18 DIAGNOSIS — E1129 Type 2 diabetes mellitus with other diabetic kidney complication: Secondary | ICD-10-CM

## 2014-02-03 ENCOUNTER — Ambulatory Visit (INDEPENDENT_AMBULATORY_CARE_PROVIDER_SITE_OTHER): Payer: BC Managed Care – PPO | Admitting: Family Medicine

## 2014-02-03 ENCOUNTER — Encounter: Payer: Self-pay | Admitting: Family Medicine

## 2014-02-03 VITALS — BP 120/92 | HR 70 | Temp 98.0°F | Ht 74.0 in | Wt 219.9 lb

## 2014-02-03 DIAGNOSIS — R7401 Elevation of levels of liver transaminase levels: Secondary | ICD-10-CM

## 2014-02-03 DIAGNOSIS — R740 Nonspecific elevation of levels of transaminase and lactic acid dehydrogenase [LDH]: Secondary | ICD-10-CM

## 2014-02-03 DIAGNOSIS — IMO0002 Reserved for concepts with insufficient information to code with codable children: Secondary | ICD-10-CM

## 2014-02-03 DIAGNOSIS — J069 Acute upper respiratory infection, unspecified: Secondary | ICD-10-CM

## 2014-02-03 DIAGNOSIS — E1165 Type 2 diabetes mellitus with hyperglycemia: Secondary | ICD-10-CM

## 2014-02-03 DIAGNOSIS — E1129 Type 2 diabetes mellitus with other diabetic kidney complication: Secondary | ICD-10-CM

## 2014-02-03 DIAGNOSIS — E789 Disorder of lipoprotein metabolism, unspecified: Secondary | ICD-10-CM

## 2014-02-03 MED ORDER — BENZONATATE 100 MG PO CAPS: 100.0000 mg | ORAL_CAPSULE | Freq: Two times a day (BID) | ORAL | Status: DC | PRN

## 2014-02-03 NOTE — Progress Notes (Signed)
Pre visit review using our clinic review tool, if applicable. No additional management support is needed unless otherwise documented below in the visit note. 

## 2014-02-03 NOTE — Patient Instructions (Addendum)
-  try the cough medication - follow up if worsening or new symptoms or not improving  -schedule your ultrasound  -avoid tylenol, cholesterol medication and alcohol for the next few months  -schedule diabetic eye exam  -follow up as scheduled

## 2014-02-03 NOTE — Progress Notes (Signed)
HPI:  Acute visit for:  Cough: -started: 4-5 days - getting a little better (muscle pain and chills resolving) -symptoms: congestion, cough, sore throat, subjective mild fever, body aches, chills -denies: SOB, wheezing, document fevers, NVD, sinus or tooth pain -no known flu exposure -has tried: tylenol, OTC cold medication  Elevated lfts: -dx in [place] when drinking a lot and had hep A -he did not get the [place] we advised -he also was told to hold statin, alcohol and tylenol to see if resolved  HM: -optho exam -influenzae vaccine - declined  ROS: See pertinent positives and negatives per HPI.  Past Medical History  Diagnosis Date  . Diabetes mellitus without complication   . Hyperlipidemia   . OSA on CPAP   . Type 2 diabetes, uncontrolled, with renal manifestation 04/09/2013    No past surgical history on file.  Family History  Problem Relation Age of Onset  . Cancer Father   . Diabetes Father   . Hypertension Father     History   Social History  . Marital Status: Married    Spouse Name: N/A    Number of Children: N/A  . Years of Education: N/A   Social History Main Topics  . Smoking status: Light Tobacco Smoker  . Smokeless tobacco: None     Comment:  1-2 cig per week  . Alcohol Use: Yes     Comment: rarely  . Drug Use: No  . Sexual Activity: None   Other Topics Concern  . None   Social History Narrative   Work or School: Publishing copyresearch statistic instructor      Home Situation: lives with wife and child      Spiritual Beliefs: Christian      Lifestyle: no exercise, diet ok             Current outpatient prescriptions: ACCU-CHEK FASTCLIX LANCETS MISC, Test twice daily., Disp: 102 each, Rfl: 5;  aspirin 81 MG tablet, Take 81 mg by mouth daily., Disp: , Rfl: ;  glucose blood (ACCU-CHEK SMARTVIEW) test strip, Use as directed to check blood sugar., Disp: 100 each, Rfl: 3;  lisinopril (ZESTRIL) 2.5 MG tablet, Take 1 tablet (2.5 mg total) by mouth daily.,  Disp: 90 tablet, Rfl: 1 metFORMIN (GLUCOPHAGE) 1000 MG tablet, Take 1 tablet (1,000 mg total) by mouth 2 (two) times daily with a meal., Disp: 180 tablet, Rfl: 1;  sitaGLIPtin (JANUVIA) 50 MG tablet, Take 1 tablet (50 mg total) by mouth daily., Disp: 90 tablet, Rfl: 1;  benzonatate (TESSALON) 100 MG capsule, Take 1 capsule (100 mg total) by mouth 2 (two) times daily as needed for cough., Disp: 20 capsule, Rfl: 0  EXAM:  Filed Vitals:   02/03/14 1052  BP: 120/92  Pulse: 70  Temp: 98 F (36.7 C)    Body mass index is 28.22 kg/(m^2).  GENERAL: vitals reviewed and listed above, alert, oriented, appears well hydrated and in no acute distress  HEENT: atraumatic, conjunttiva clear, no obvious abnormalities on inspection of external nose and ears, normal appearance of ear canals and TMs, clear nasal congestion, mild post oropharyngeal erythema with PND, no tonsillar edema or exudate, no sinus TTP  NECK: no obvious masses on inspection  LUNGS: clear to auscultation bilaterally, no wheezes, rales or rhonchi, good air movement  CV: HRRR, no peripheral edema  MS: moves all extremities without noticeable abnormality  PSYCH: pleasant and cooperative, no obvious depression or anxiety  ASSESSMENT AND PLAN:  Discussed the following assessment and plan:  Acute upper respiratory infection - Plan: benzonatate (TESSALON) 100 MG capsule -likely viral, supportive care and return precuations  Lipid disorder  Type 2 diabetes, uncontrolled, with renal manifestation  Transaminitis  -Patient advised to return or notify a doctor immediately if symptoms worsen or persist or new concerns arise.  Patient Instructions  -try the cough medication - follow up if worsening or new symptoms or not improving  -schedule your ultrasound  -avoid tylenol, cholesterol medication and alcohol for the next few months  -schedule diabetic eye exam  -follow up as scheduled     An, [name].

## 2014-02-04 ENCOUNTER — Encounter: Payer: Self-pay | Admitting: Family Medicine

## 2014-02-10 ENCOUNTER — Ambulatory Visit
Admission: RE | Admit: 2014-02-10 | Discharge: 2014-02-10 | Disposition: A | Discharge status: Home / Self Care | Payer: BC Managed Care – PPO | Source: Ambulatory Visit | Attending: Family Medicine | Admitting: Family Medicine

## 2014-02-10 ENCOUNTER — Encounter: Payer: Self-pay | Admitting: Gastroenterology

## 2014-02-10 ENCOUNTER — Other Ambulatory Visit: Payer: Self-pay | Admitting: *Deleted

## 2014-02-10 DIAGNOSIS — R7401 Elevation of levels of liver transaminase levels: Secondary | ICD-10-CM

## 2014-02-10 DIAGNOSIS — R935 Abnormal findings on diagnostic imaging of other abdominal regions, including retroperitoneum: Secondary | ICD-10-CM

## 2014-02-10 DIAGNOSIS — R740 Nonspecific elevation of levels of transaminase and lactic acid dehydrogenase [LDH]: Principal | ICD-10-CM

## 2014-03-20 ENCOUNTER — Ambulatory Visit (INDEPENDENT_AMBULATORY_CARE_PROVIDER_SITE_OTHER): Payer: BC Managed Care – PPO | Admitting: Family Medicine

## 2014-03-20 ENCOUNTER — Encounter: Payer: Self-pay | Admitting: Family Medicine

## 2014-03-20 VITALS — BP 126/88 | HR 82 | Temp 97.9°F | Ht 74.0 in | Wt 223.3 lb

## 2014-03-20 DIAGNOSIS — IMO0002 Reserved for concepts with insufficient information to code with codable children: Secondary | ICD-10-CM

## 2014-03-20 DIAGNOSIS — E789 Disorder of lipoprotein metabolism, unspecified: Secondary | ICD-10-CM

## 2014-03-20 DIAGNOSIS — R740 Nonspecific elevation of levels of transaminase and lactic acid dehydrogenase [LDH]: Secondary | ICD-10-CM

## 2014-03-20 DIAGNOSIS — G4733 Obstructive sleep apnea (adult) (pediatric): Secondary | ICD-10-CM

## 2014-03-20 DIAGNOSIS — R7401 Elevation of levels of liver transaminase levels: Secondary | ICD-10-CM

## 2014-03-20 DIAGNOSIS — E1165 Type 2 diabetes mellitus with hyperglycemia: Secondary | ICD-10-CM

## 2014-03-20 DIAGNOSIS — R1011 Right upper quadrant pain: Secondary | ICD-10-CM

## 2014-03-20 DIAGNOSIS — E1129 Type 2 diabetes mellitus with other diabetic kidney complication: Secondary | ICD-10-CM

## 2014-03-20 LAB — HEMOGLOBIN A1C: Hgb A1c MFr Bld: 7.1 % — ABNORMAL HIGH (ref 4.6–6.5)

## 2014-03-20 NOTE — Progress Notes (Signed)
HPI:  Follow up:  Diabetes Mellitis: -meds: metformin  bid, januvia  daily, asa, acei -complications: none -referred to diabetes educator and have strongly advised a healthy low carb diet and regular exercise -last eye exam: has not done -reports fastin gBS around 120-130s -reports: no exercise, trying to work on diet some -denies: polyuria, vision changes, wounds on feet, vision changes  HLD/Transamnitis: -held statin due to elevated LFTs to see if helped, [place] with  coarse echogenic hepatic echotexture consistent with fatty infiltration and/or hepatocellular disease. -reports: has some RUQ discomfort/ mild pain for months - intermittently, several times per week, lasts for a few hours, can't think of precipitating factors; also has frequent belching - this is not new -seeing GI for the transaminitis - likely fatty liver - but he had reported normal [place] in [place] last year when had reported Hep A and was drinking more alcohol -I had advised refraining from alcohol, tylenol and statins for 3 months and healthy diet and exercise in this setting but liver enzymes remained mildly elevated  OSA/Smoking: - 1 pack cigerettes per week - using cpap -thinks he could quit   ROS: See pertinent positives and negatives per HPI.  Past Medical History  Diagnosis Date  . Diabetes mellitus without complication   . Hyperlipidemia   . OSA on CPAP   . Type 2 diabetes, uncontrolled, with renal manifestation 04/09/2013    No past surgical history on file.  Family History  Problem Relation Age of Onset  . Cancer Father   . Diabetes Father   . Hypertension Father     History   Social History  . Marital Status: Married    Spouse Name: N/A  . Number of Children: N/A  . Years of Education: N/A   Social History Main Topics  . Smoking status: Light Tobacco Smoker  . Smokeless tobacco: Not on file     Comment:  1-2 cig per week  . Alcohol Use: Yes     Comment: rarely  . Drug Use:  No  . Sexual Activity: Not on file   Other Topics Concern  . 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  ACCU-CHEK FASTCLIX LANCETS MISC, Test twice daily., Disp: 102 each, Rfl: 5 .  aspirin 81 MG tablet, Take 81 mg by mouth daily., Disp: , Rfl:  .  glucose blood (ACCU-CHEK SMARTVIEW) test strip, Use as directed to check blood sugar., Disp: 100 each, Rfl: 3 .  lisinopril (ZESTRIL) 2.5 MG tablet, Take 1 tablet (2.5 mg total) by mouth daily., Disp: 90 tablet, Rfl: 1 .  metFORMIN (GLUCOPHAGE) 1000 MG tablet, Take 1 tablet (1,000 mg total) by mouth 2 (two) times daily with a meal., Disp: 180 tablet, Rfl: 1 .  sitaGLIPtin (JANUVIA) 50 MG tablet, Take 1 tablet (50 mg total) by mouth daily., Disp: 90 tablet, Rfl: 1  EXAM:  Filed Vitals:   03/20/14 1522  BP: 126/88  Pulse: 82  Temp: 97.9 F (36.6 C)    Body mass index is 28.66 kg/(m^2).  GENERAL: vitals reviewed and listed above, alert, oriented, appears well hydrated and in no acute distress  HEENT: atraumatic, conjunttiva clear, no obvious abnormalities on inspection  of external nose and ears  NECK: no obvious masses on inspection  LUNGS: clear to auscultation bilaterally, no wheezes, rales or rhonchi, good air movement  CV: HRRR, no peripheral edema  MS: moves all extremities without noticeable abnormality  PSYCH: pleasant and cooperative, no obvious depression or anxiety  ASSESSMENT AND PLAN:  Discussed the following assessment and plan:  Type 2 diabetes, uncontrolled, with renal manifestation - Plan: Hemoglobin A1c -advised healthy low carb diet and regular exercise  Lipid disorder -advised we likely will restart stating after eval with GI  Transaminitis/RUQ: -likely fatty liver -he has ongoing  discomfort and mildly elevated LFTs and is seeing GI for eval to exclude other etiologies -if not other dx, advise will restart statin, healthy diet, regular exercise and advised to quit smoking  Tobacco Use: -counseled and advised to quit, offered help, quitline info given  Obstructive sleep apnea   -Patient advised to return or notify a doctor immediately if symptoms worsen or persist or new concerns arise.  Patient Instructions  BEFORE YOU LEAVE: -hgba1c -follow up 3 months  We recommend the following healthy lifestyle measures: - eat a healthy diet consisting of lots of vegetables, fruits, beans, nuts, seeds, healthy meats such as white chicken and fish and whole grains.  - avoid fried foods, fast food, processed foods, sodas, red meet and other fattening foods.  - get a least 150 minutes of aerobic exercise per week.   See the gastroenterologist as scheduled for the stomach discomfort and the liver issues - likely will restart the cholesterol medication after their evaluation.  Quit smoking: -quit smoking! Your health and life depend on it -nicotine gum or lozenge if needed for cravings -call quit line     Zielinski, Ollin Hochmuth R.

## 2014-03-20 NOTE — Progress Notes (Signed)
Pre visit review using our clinic review tool, if applicable. No additional management support is needed unless otherwise documented below in the visit note. 

## 2014-03-20 NOTE — Patient Instructions (Addendum)
BEFORE YOU LEAVE: -hgba1c -follow up 3 months  We recommend the following healthy lifestyle measures: - eat a healthy diet consisting of lots of vegetables, fruits, beans, nuts, seeds, healthy meats such as white chicken and fish and whole grains.  - avoid fried foods, fast food, processed foods, sodas, red meet and other fattening foods.  - get a least 150 minutes of aerobic exercise per week.   See the gastroenterologist as scheduled for the stomach discomfort and the liver issues - likely will restart the cholesterol medication after their evaluation.  Quit smoking: -quit smoking! Your health and life depend on it -nicotine gum or lozenge if needed for cravings -call quit line

## 2014-03-23 ENCOUNTER — Encounter: Payer: Self-pay | Admitting: Family Medicine

## 2014-03-27 ENCOUNTER — Other Ambulatory Visit: Payer: Self-pay

## 2014-03-27 ENCOUNTER — Encounter: Payer: Self-pay | Admitting: Gastroenterology

## 2014-03-27 ENCOUNTER — Other Ambulatory Visit (INDEPENDENT_AMBULATORY_CARE_PROVIDER_SITE_OTHER): Payer: BC Managed Care – PPO

## 2014-03-27 ENCOUNTER — Other Ambulatory Visit: Payer: Self-pay | Admitting: Family Medicine

## 2014-03-27 ENCOUNTER — Ambulatory Visit (INDEPENDENT_AMBULATORY_CARE_PROVIDER_SITE_OTHER): Payer: BC Managed Care – PPO | Admitting: Gastroenterology

## 2014-03-27 VITALS — BP 118/78 | HR 84 | Ht 74.0 in | Wt 222.4 lb

## 2014-03-27 DIAGNOSIS — R7989 Other specified abnormal findings of blood chemistry: Secondary | ICD-10-CM

## 2014-03-27 DIAGNOSIS — R748 Abnormal levels of other serum enzymes: Secondary | ICD-10-CM

## 2014-03-27 DIAGNOSIS — R945 Abnormal results of liver function studies: Principal | ICD-10-CM

## 2014-03-27 LAB — HEPATIC FUNCTION PANEL
ALBUMIN: 4.6 g/dL (ref 3.5–5.2)
ALT: 99 U/L — AB (ref 0–53)
AST: 53 U/L — ABNORMAL HIGH (ref 0–37)
Alkaline Phosphatase: 63 U/L (ref 39–117)
Bilirubin, Direct: 0.2 mg/dL (ref 0.0–0.3)
Total Bilirubin: 0.9 mg/dL (ref 0.2–1.2)
Total Protein: 7.6 g/dL (ref 6.0–8.3)

## 2014-03-27 NOTE — Patient Instructions (Signed)
You will have labs checked today in the basement lab.  Please head down after you check out with the front desk  (LFTs). If the LFTs are still elevated now, you can resume your cholesterol medicine and you will also need further bloodwork.

## 2014-03-27 NOTE — Progress Notes (Signed)
HPI: This is a   very pleasant 44 year old man who was [place] He moved here 10-12 years ago.     US 02/2014 suggested fatty liver, no mass lesions, no biliary pathology Labs: 2015 have showed elevated transaminases (ALT 100, AST 60s), other LFTs normal. 09/2013 Hepatitis B surface antigen and surface antibody were both negative.  Stopped statin 3 months ago, has not had repeat LFTs since then.  Was told his liver tests were elevated 10-12 years ago.  Says he was on some type of medicine for about 2 years. He believes it was for fatty liver. He does not recall what the medicine was.  Not sure about hep viruses.    Has minor pressure in RUQ.  He had [place] sigmoid[place]. We do not have those results here but he tells me they were all completely normal  Review of systems: Pertinent positive and negative review of systems were noted in the above HPI section. Complete review of systems was performed and was otherwise normal.    Past Medical History  Diagnosis Date  . Diabetes mellitus without complication   . Hyperlipidemia   . OSA on CPAP   . Type 2 diabetes, uncontrolled, with renal manifestation 04/09/2013    Past Surgical History  Procedure Laterality Date  . No past surgeries      Current Outpatient Prescriptions  Medication Sig Dispense Refill  . ACCU-CHEK FASTCLIX LANCETS MISC Test twice daily. 102 each 5  . aspirin 81 MG tablet Take 81 mg by mouth daily.    Marland Kitchen. glucose blood (ACCU-CHEK SMARTVIEW) test strip Use as directed to check blood sugar. 100 each 3  . lisinopril (ZESTRIL) 2.5 MG tablet Take 1 tablet (2.5 mg total) by mouth daily. 90 tablet 1  . metFORMIN (GLUCOPHAGE) 1000 MG tablet Take 1 tablet (1,000 mg total) by mouth 2 (two) times daily with a meal. 180 tablet 1  . sitaGLIPtin (JANUVIA) 50 MG tablet Take 1 tablet (50 mg total) by mouth daily. 90 tablet 1   No current facility-administered medications for  this visit.    Allergies as of 03/27/2014 - Review Complete 03/27/2014  Allergen Reaction Noted  . Shellfish allergy  02/10/2013    Family History  Problem Relation Age of Onset  . Bone cancer Father   . Diabetes Father   . Hypertension Father   . Colon cancer Neg Hx   . Colon polyps Neg Hx   . Kidney disease Neg Hx   . Esophageal cancer Neg Hx   . Heart disease Neg Hx     History   Social History  . Marital Status: Married    Spouse Name: N/A  . Number of Children: 2  . Years of Education: N/A   Occupational History  . Professor     [employer]   Social History Main Topics  . Smoking status: Light Tobacco Smoker -- 0.25 packs/day for 20 years    Types: Cigarettes  . Smokeless tobacco: Former NeurosurgeonUser    Types: Chew     Comment:  1-2 cig per week  . Alcohol Use: 0.0 oz/week    0 Standard drinks or equivalent per week     Comment: rarely  . Drug Use: No  . Sexual Activity: Not on file   Other Topics Concern  . 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  Home Situation: lives with wife and child      Spiritual Beliefs: Christian      Lifestyle: no exercise, diet ok                Physical Exam: BP 118/78 mmHg  Pulse 84  Ht  (1.88 m)  Wt 222 lb 6 oz (100.869 kg)  BMI 28.54 kg/m2 Constitutional: generally well-appearing Psychiatric: alert and oriented x3 Eyes: extraocular movements intact Mouth: oral pharynx moist, no lesions Neck: supple no lymphadenopathy Cardiovascular: heart regular rate and rhythm Lungs: clear to auscultation bilaterally Abdomen: soft, nontender, nondistended, no obvious ascites, no peritoneal signs, normal bowel sounds Extremities: no lower extremity edema bilaterally Skin: no lesions on visible extremities    Assessment and plan: 44 y.o. male with  long-term history of elevated liver tests, likely underlying fatty liver disease  He was told he had fatty  liver disease 10-12 years ago in [place]. He tells me he started some sort of a medicine for that he does not recall what it was or whether it helped or not. He is not aware of what blood tests he had to exclude other sources of liver irritation. He stopped a statin medicine 3 months ago and we will recheck his liver tests today. If his LFTs are still elevated then I will recommend he restart his statin at its previous dose and he will undergo further blood tests to exclude other causes of elevated liver tests such as hepatitis C, autoimmune processes etc.

## 2014-04-02 ENCOUNTER — Other Ambulatory Visit: Payer: Self-pay

## 2014-04-02 DIAGNOSIS — R7989 Other specified abnormal findings of blood chemistry: Secondary | ICD-10-CM

## 2014-04-02 DIAGNOSIS — R945 Abnormal results of liver function studies: Principal | ICD-10-CM

## 2014-04-02 NOTE — Progress Notes (Signed)
Pt aware to have labs and will be in next week

## 2014-04-03 ENCOUNTER — Other Ambulatory Visit (INDEPENDENT_AMBULATORY_CARE_PROVIDER_SITE_OTHER): Payer: BC Managed Care – PPO

## 2014-04-03 DIAGNOSIS — R7989 Other specified abnormal findings of blood chemistry: Secondary | ICD-10-CM | POA: Diagnosis not present

## 2014-04-03 DIAGNOSIS — R945 Abnormal results of liver function studies: Principal | ICD-10-CM

## 2014-04-03 LAB — COMPREHENSIVE METABOLIC PANEL
ALT: 101 U/L — AB (ref 0–53)
AST: 57 U/L — ABNORMAL HIGH (ref 0–37)
Albumin: 4.6 g/dL (ref 3.5–5.2)
Alkaline Phosphatase: 64 U/L (ref 39–117)
BILIRUBIN TOTAL: 0.8 mg/dL (ref 0.2–1.2)
BUN: 15 mg/dL (ref 6–23)
CO2: 31 mEq/L (ref 19–32)
CREATININE: 1.08 mg/dL (ref 0.40–1.50)
Calcium: 9.9 mg/dL (ref 8.4–10.5)
Chloride: 99 mEq/L (ref 96–112)
GFR: 78.23 mL/min (ref >60.00)
Glucose, Bld: 282 mg/dL — ABNORMAL HIGH (ref 70–99)
Potassium: 4.1 mEq/L (ref 3.5–5.1)
SODIUM: 138 meq/L (ref 135–145)
TOTAL PROTEIN: 7.4 g/dL (ref 6.0–8.3)

## 2014-04-03 LAB — IBC PANEL
IRON: 149 ug/dL (ref 42–165)
Saturation Ratios: 42.6 % (ref 20.0–50.0)
Transferrin: 250 mg/dL (ref 212.0–360.0)

## 2014-04-03 LAB — CBC WITH DIFFERENTIAL/PLATELET
Basophils Absolute: 0 10*3/uL (ref 0.0–0.1)
Basophils Relative: 0.5 % (ref 0.0–3.0)
EOS PCT: 2 % (ref 0.0–5.0)
Eosinophils Absolute: 0.1 10*3/uL (ref 0.0–0.7)
HEMATOCRIT: 48.3 % (ref 39.0–52.0)
HEMOGLOBIN: 17 g/dL (ref 13.0–17.0)
LYMPHS ABS: 2.1 10*3/uL (ref 0.7–4.0)
Lymphocytes Relative: 36.2 % (ref 12.0–46.0)
MCHC: 35.2 g/dL (ref 30.0–36.0)
MCV: 84.9 fl (ref 78.0–100.0)
MONO ABS: 0.4 10*3/uL (ref 0.1–1.0)
MONOS PCT: 7.8 % (ref 3.0–12.0)
NEUTROS ABS: 3.1 10*3/uL (ref 1.4–7.7)
Neutrophils Relative %: 53.5 % (ref 43.0–77.0)
Platelets: 157 10*3/uL (ref 150.0–400.0)
RBC: 5.7 Mil/uL (ref 4.22–5.81)
RDW: 14.1 % (ref 11.5–15.5)
WBC: 5.7 10*3/uL (ref 4.0–10.5)

## 2014-04-03 LAB — FERRITIN: Ferritin: 678.5 ng/mL — ABNORMAL HIGH (ref 22.0–322.0)

## 2014-04-03 LAB — PROTIME-INR
INR: 1 ratio (ref 0.8–1.0)
PROTHROMBIN TIME: 10.8 s (ref 9.6–13.1)

## 2014-04-03 LAB — IGA: IGA: 249 mg/dL (ref 68–378)

## 2014-04-04 LAB — HEPATITIS A ANTIBODY, TOTAL: Hep A Total Ab: NONREACTIVE

## 2014-04-04 LAB — HEPATITIS B SURFACE ANTIGEN: HEP B S AG: NEGATIVE

## 2014-04-04 LAB — HEPATITIS B SURFACE ANTIBODY,QUALITATIVE: Hep B S Ab: POSITIVE — AB

## 2014-04-04 LAB — HEPATITIS C ANTIBODY: HCV Ab: NEGATIVE

## 2014-04-06 LAB — TISSUE TRANSGLUTAMINASE, IGA: Tissue Transglutaminase Ab, IgA: 1 U/mL (ref <4)

## 2014-04-06 LAB — ANA: Anti Nuclear Antibody(ANA): NEGATIVE

## 2014-04-06 LAB — ANTI-SMOOTH MUSCLE ANTIBODY, IGG: Smooth Muscle Ab: 6 U (ref <20)

## 2014-04-07 LAB — MITOCHONDRIAL ANTIBODIES: Mitochondrial M2 Ab, IgG: 0.35 (ref <0.91)

## 2014-04-08 ENCOUNTER — Other Ambulatory Visit: Payer: Self-pay

## 2014-04-08 DIAGNOSIS — R7989 Other specified abnormal findings of blood chemistry: Secondary | ICD-10-CM

## 2014-04-17 ENCOUNTER — Other Ambulatory Visit: Payer: BC Managed Care – PPO

## 2014-04-17 DIAGNOSIS — R7989 Other specified abnormal findings of blood chemistry: Secondary | ICD-10-CM

## 2014-04-21 LAB — HEMOCHROMATOSIS DNA-PCR(C282Y,H63D)

## 2014-05-11 ENCOUNTER — Encounter: Payer: Self-pay | Admitting: Gastroenterology

## 2014-05-11 ENCOUNTER — Ambulatory Visit (INDEPENDENT_AMBULATORY_CARE_PROVIDER_SITE_OTHER): Payer: BC Managed Care – PPO | Admitting: Gastroenterology

## 2014-05-11 NOTE — Patient Instructions (Addendum)
We will try to help determine cost of liver biopsy for iron overload through interventional radiology. We have put the order in for you to have [facility] We will have our pre-cert coordinator contact you with the cost of the liver biopsy

## 2014-05-11 NOTE — Progress Notes (Signed)
Review of pertinent gastrointestinal problems: 1. Elevated liver tests; Transaminases chronically elevated;  Evaluated in [place] as well, he believes they felt it was from lipids (perhaps fatty liver?).  Labs 02/2014: Ferritin 658, Transferrin saturation 59%, DNA testing indicates that this individual is negative for the C282Y and H63D mutations in the HFE gene. Hepatitis B surface antibody positive, hepatitis B surface antigen negative, hepatitis A antibodies negative, hepatitis C antibody negative, celiac sprue testing negative, AMA, AMA, anti-smooth muscle antibody were all negative, ceruloplasmin negative.  Abdominal ultrasound 1 2016 suggested no cirrhosis but underlying steatosis.    HPI: This is a very pleasant 44 year old man who has chronically elevated liver transaminases.  He is here today to discuss next step in evaluation.    Past Medical History  Diagnosis Date  . Diabetes mellitus without complication   . Hyperlipidemia   . OSA on CPAP   . Type 2 diabetes, uncontrolled, with renal manifestation 04/09/2013    Past Surgical History  Procedure Laterality Date  . No past surgeries      Current Outpatient Prescriptions  Medication Sig Dispense Refill  . ACCU-CHEK FASTCLIX LANCETS MISC Test twice daily. 102 each 5  . aspirin 81 MG tablet Take 81 mg by mouth daily.    Marland Kitchen glucose blood (ACCU-CHEK SMARTVIEW) test strip Use as directed to check blood sugar. 100 each 3  . JANUVIA 50 MG tablet TAKE 1 TABLET (50 MG TOTAL) BY MOUTH DAILY. 90 tablet 0  . lisinopril (PRINIVIL,ZESTRIL) 2.5 MG tablet TAKE 1 TABLET (2.5 MG TOTAL) BY MOUTH DAILY. 90 tablet 0  . metFORMIN (GLUCOPHAGE) 1000 MG tablet Take 1 tablet (1,000 mg total) by mouth 2 (two) times daily with a meal. 180 tablet 1   No current facility-administered medications for this visit.    Allergies as of 05/11/2014 - Review Complete 05/11/2014  Allergen Reaction Noted  . Shellfish allergy  02/10/2013    Family History   Problem Relation Age of Onset  . Bone cancer Father   . Diabetes Father   . Hypertension Father   . Colon cancer Neg Hx   . Colon polyps Neg Hx   . Kidney disease Neg Hx   . Esophageal cancer Neg Hx   . Heart disease Neg Hx     History   Social History  . Marital Status: Married    Spouse Name: N/A  . Number of Children: 2  . Years of Education: N/A   Occupational History  . Professor     [employer]   Social History Main Topics  . Smoking status: Light Tobacco Smoker -- 0.25 packs/day for 20 years    Types: Cigarettes  . Smokeless tobacco: Former Neurosurgeon    Types: Chew     Comment:  1-2 cig per week  . Alcohol Use: 0.0 oz/week    0 Standard drinks or equivalent per week     Comment: rarely  . Drug Use: No  . Sexual Activity: Not on file   Other Topics Concern  . 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 male with chronically elevated transaminases, elevated ferritin and elevated transferrin saturation  He may indeed have iron overload. He had a fruitful, long discussion about the nature of iron overload, the genetics behind hereditary hemochromatosis. I explained to him that I am not sure if he truly has iron overload. If he did however it would best to be discovered sooner rather than later to begin phlebotomy and hopefully prevent end organ dysfunction such as cirrhosis, cardiomyopathy. I explained that probably the best way to know for certain if he has iron  overload is by a liver biopsy. He is not sure if he wants to undergo that for cost reasons mainly. We will try to give details of out-of-pocket expense for him to help him with this decision. If he decides against that I'll probably follow his liver tests serially as well as iron testing serially.

## 2014-05-15 ENCOUNTER — Encounter: Payer: Self-pay | Admitting: Gastroenterology

## 2014-05-18 ENCOUNTER — Encounter: Payer: Self-pay | Admitting: Gastroenterology

## 2014-05-19 ENCOUNTER — Ambulatory Visit (HOSPITAL_COMMUNITY): Admission: RE | Admit: 2014-05-19 | Payer: BC Managed Care – PPO | Source: Ambulatory Visit

## 2014-05-19 ENCOUNTER — Inpatient Hospital Stay (HOSPITAL_COMMUNITY): Admission: RE | Admit: 2014-05-19 | Payer: BC Managed Care – PPO | Source: Ambulatory Visit

## 2014-05-19 ENCOUNTER — Telehealth: Payer: Self-pay

## 2014-05-19 NOTE — Telephone Encounter (Signed)
Dr Christella HartiganJacobs IR states that the pt cancelled the liver biopsy and said the med onc wanted him to wait until after May 11 to have the biopsy done.

## 2014-05-19 NOTE — Telephone Encounter (Signed)
-----   Message from Rachael Feeaniel P Jacobs, MD sent at 05/17/2014  7:04 AM EDT ----- David FreestonePatty, He is getting liver biopsy next week, can you call IR to make sure that his liver tissue is sent for IRON testing.  Thanks

## 2014-05-19 NOTE — Telephone Encounter (Signed)
Ok, thanks.

## 2014-05-19 NOTE — Telephone Encounter (Signed)
Message left for radiologist to confirm receipt of the message regarding liver tissue.

## 2014-05-25 ENCOUNTER — Other Ambulatory Visit: Payer: Self-pay | Admitting: Family Medicine

## 2014-06-23 ENCOUNTER — Encounter: Payer: Self-pay | Admitting: Family Medicine

## 2014-06-23 ENCOUNTER — Ambulatory Visit: Payer: BC Managed Care – PPO | Admitting: Family Medicine

## 2014-06-23 ENCOUNTER — Ambulatory Visit (INDEPENDENT_AMBULATORY_CARE_PROVIDER_SITE_OTHER): Payer: BC Managed Care – PPO | Admitting: Family Medicine

## 2014-06-23 ENCOUNTER — Other Ambulatory Visit: Payer: Self-pay | Admitting: Family Medicine

## 2014-06-23 VITALS — BP 118/82 | HR 79 | Temp 98.1°F | Ht 74.0 in | Wt 223.2 lb

## 2014-06-23 DIAGNOSIS — E1165 Type 2 diabetes mellitus with hyperglycemia: Secondary | ICD-10-CM | POA: Diagnosis not present

## 2014-06-23 DIAGNOSIS — IMO0002 Reserved for concepts with insufficient information to code with codable children: Secondary | ICD-10-CM

## 2014-06-23 DIAGNOSIS — G4733 Obstructive sleep apnea (adult) (pediatric): Secondary | ICD-10-CM | POA: Diagnosis not present

## 2014-06-23 DIAGNOSIS — E789 Disorder of lipoprotein metabolism, unspecified: Secondary | ICD-10-CM | POA: Diagnosis not present

## 2014-06-23 DIAGNOSIS — E1129 Type 2 diabetes mellitus with other diabetic kidney complication: Secondary | ICD-10-CM | POA: Diagnosis not present

## 2014-06-23 DIAGNOSIS — Z720 Tobacco use: Secondary | ICD-10-CM | POA: Diagnosis not present

## 2014-06-23 DIAGNOSIS — F17200 Nicotine dependence, unspecified, uncomplicated: Secondary | ICD-10-CM

## 2014-06-23 LAB — HEMOGLOBIN A1C: HEMOGLOBIN A1C: 7.2 % — AB (ref 4.6–6.5)

## 2014-06-23 MED ORDER — SITAGLIPTIN PHOSPHATE 100 MG PO TABS: ORAL_TABLET | ORAL | Status: DC

## 2014-06-23 MED ORDER — METFORMIN HCL 1000 MG PO TABS: 1000.0000 mg | ORAL_TABLET | Freq: Two times a day (BID) | ORAL | Status: DC

## 2014-06-23 MED ORDER — LISINOPRIL 2.5 MG PO TABS: ORAL_TABLET | ORAL | Status: DC

## 2014-06-23 MED ORDER — SITAGLIPTIN PHOSPHATE 50 MG PO TABS: ORAL_TABLET | ORAL | Status: DC

## 2014-06-23 NOTE — Progress Notes (Signed)
HPI:  Diabetes Mellitis: -meds: metformin 1000mg  bid, januvia 50mg  daily, asa, acei -complications: none -referred to diabetes educator and have strongly advised a healthy low carb diet and regular exercise -last eye exam: has not done this - but has appt over the summer -reports fasting BS around 120-130s -reports: no exercise, trying to work on diet some -denies: polyuria, vision changes, wounds on feet, vision changes  HLD: -statin in the past held due to elevated liver enzymes  Transaminitis: -undergoing eval with GI -has elevated ferritin and is considering liver biopsy -genetic testing negative  OSA/Smoking: - 1 pack cigerettes per week - using cpap -thinks he could quit   ROS: See pertinent positives and negatives per HPI.  Past Medical History  Diagnosis Date  . Diabetes mellitus without complication   . Hyperlipidemia   . OSA on CPAP   . Type 2 diabetes, uncontrolled, with renal manifestation 04/09/2013    Past Surgical History  Procedure Laterality Date  . No past surgeries      Family History  Problem Relation Age of Onset  . Bone cancer Father   . Diabetes Father   . Hypertension Father   . Colon cancer Neg Hx   . Colon polyps Neg Hx   . Kidney disease Neg Hx   . Esophageal cancer Neg Hx   . Heart disease Neg Hx     History   Social History  . Marital Status: Married    Spouse Name: N/A  . Number of Children: 2  . Years of Education: N/A   Occupational History  . Professor     [employer]   Social History Main Topics  . Smoking status: Light Tobacco Smoker -- 0.25 packs/day for 20 years    Types: Cigarettes  . Smokeless tobacco: Former NeurosurgeonUser    Types: Chew     Comment:  1-2 cig per week  . Alcohol Use: 0.0 oz/week    0 Standard drinks or equivalent per week     Comment: rarely  . Drug Use: No  . Sexual Activity: Not on file   Other Topics Concern  . None   Social History Narrative   Work or School: Biomedical engineerresearch statistic  instructor      Home Situation: lives with wife and child      Spiritual Beliefs: Christian      Lifestyle: no exercise, diet ok              Current outpatient prescriptions:  .  ACCU-CHEK FASTCLIX LANCETS MISC, Test twice daily., Disp: 102 each, Rfl: 5 .  aspirin 81 MG tablet, Take 81 mg by mouth daily., Disp: , Rfl:  .  glucose blood (ACCU-CHEK SMARTVIEW) test strip, Use as directed to check blood sugar., Disp: 100 each, Rfl: 3 .  lisinopril (PRINIVIL,ZESTRIL) 2.5 MG tablet, TAKE 1 TABLET (2.5 MG TOTAL) BY MOUTH DAILY., Disp: 90 tablet, Rfl: 3 .  metFORMIN (GLUCOPHAGE) 1000 MG tablet, Take 1 tablet (1,000 mg total) by mouth 2 (two) times daily with a meal., Disp: 180 tablet, Rfl: 3 .  sitaGLIPtin (JANUVIA) 50 MG tablet, TAKE 1 TABLET (50 MG TOTAL) BY MOUTH DAILY., Disp: 90 tablet, Rfl: 3  EXAM:  Filed Vitals:   06/23/14 0845  BP: 118/82  Pulse: 79  Temp: 98.1 F (36.7 C)    Body mass index is 28.65 kg/(m^2).  GENERAL: vitals reviewed and listed above, alert, oriented, appears well hydrated and in no acute distress  HEENT: atraumatic, conjunttiva clear, no  obvious abnormalities on inspection of external nose and ears  NECK: no obvious masses on inspection  LUNGS: clear to auscultation bilaterally, no wheezes, rales or rhonchi, good air movement  CV: HRRR, no peripheral edema  MS: moves all extremities without noticeable abnormality  PSYCH: pleasant and cooperative, no obvious depression or anxiety  ASSESSMENT AND PLAN:  Discussed the following assessment and plan:  Type 2 diabetes, uncontrolled, with renal manifestation - Plan: Hemoglobin A1c, metFORMIN (GLUCOPHAGE) 1000 MG tablet  Lipid disorder  Obstructive sleep apnea  Tobacco use disorder  -foot exam - done -eye exam - advised -HIV - declined -labs today -advised healthy lifestyle -follow up in 3 months -Patient advised to return or notify a doctor immediately if symptoms worsen or persist or new  concerns arise.  Patient Instructions  BEFORE YOU LEAVE: -labs -follow up in 3 months  Schedule diabetic eye exam  Please quit smoking  We recommend the following healthy lifestyle measures: - eat a healthy diet consisting of lots of vegetables, fruits, beans, nuts, seeds, healthy meats such as white chicken and fish  - avoid fried foods, starches, rice, sweets, fast food, processed foods, sodas, red meet and other fattening foods.  - get a least 150 minutes of aerobic exercise per week.       Kriste BasqueKIM, HANNAH R.

## 2014-06-23 NOTE — Patient Instructions (Signed)
BEFORE YOU LEAVE: -labs -follow up in 3 months  Schedule diabetic eye exam  Please quit smoking  We recommend the following healthy lifestyle measures: - eat a healthy diet consisting of lots of vegetables, fruits, beans, nuts, seeds, healthy meats such as white chicken and fish  - avoid fried foods, starches, rice, sweets, fast food, processed foods, sodas, red meet and other fattening foods.  - get a least 150 minutes of aerobic exercise per week.

## 2014-06-23 NOTE — Progress Notes (Signed)
Pre visit review using our clinic review tool, if applicable. No additional management support is needed unless otherwise documented below in the visit note. 

## 2014-09-18 ENCOUNTER — Encounter: Payer: Self-pay | Admitting: Family Medicine

## 2014-09-18 ENCOUNTER — Ambulatory Visit (INDEPENDENT_AMBULATORY_CARE_PROVIDER_SITE_OTHER): Payer: BC Managed Care – PPO | Admitting: Family Medicine

## 2014-09-18 VITALS — BP 122/80 | HR 89 | Temp 98.6°F | Wt 218.0 lb

## 2014-09-18 DIAGNOSIS — E789 Disorder of lipoprotein metabolism, unspecified: Secondary | ICD-10-CM | POA: Diagnosis not present

## 2014-09-18 DIAGNOSIS — IMO0002 Reserved for concepts with insufficient information to code with codable children: Secondary | ICD-10-CM

## 2014-09-18 DIAGNOSIS — E1129 Type 2 diabetes mellitus with other diabetic kidney complication: Secondary | ICD-10-CM

## 2014-09-18 DIAGNOSIS — G4733 Obstructive sleep apnea (adult) (pediatric): Secondary | ICD-10-CM

## 2014-09-18 DIAGNOSIS — E1165 Type 2 diabetes mellitus with hyperglycemia: Secondary | ICD-10-CM

## 2014-09-18 MED ORDER — LISINOPRIL 2.5 MG PO TABS: ORAL_TABLET | ORAL | Status: DC

## 2014-09-18 MED ORDER — METFORMIN HCL 1000 MG PO TABS: 1000.0000 mg | ORAL_TABLET | Freq: Two times a day (BID) | ORAL | Status: DC

## 2014-09-18 NOTE — Progress Notes (Signed)
HPI:  Diabetes Mellitis: -meds: metformin 1000mg  bid, januvia 50mg  daily, asa, acei -complications: none -referred to diabetes educator and have strongly advised a healthy low carb diet and regular exercise -last eye exam: has not seen the opthomologist -reports fasting BS around 120-130s -reports: 3 days 45 minutes of aerobic exercise, trying to work on diet some -denies: polyuria, vision changes, wounds on feet, vision changes  HLD: -statin in the past held due to elevated liver enzymes  Transaminitis: -undergoing eval with GI -has elevated ferritin and is considering liver biopsy -genetic testing negative  OSA/Smoking: - 1 pack cigerettes per week  - using cpap   ROS: See pertinent positives and negatives per HPI.  Past Medical History  Diagnosis Date  . Diabetes mellitus without complication   . Hyperlipidemia   . OSA on CPAP   . Type 2 diabetes, uncontrolled, with renal manifestation 04/09/2013    Past Surgical History  Procedure Laterality Date  . No past surgeries      Family History  Problem Relation Age of Onset  . Bone cancer Father   . Diabetes Father   . Hypertension Father   . Colon cancer Neg Hx   . Colon polyps Neg Hx   . Kidney disease Neg Hx   . Esophageal cancer Neg Hx   . Heart disease Neg Hx     Social History   Social History  . Marital Status: Married    Spouse Name: N/A  . Number of Children: 2  . Years of Education: N/A   Occupational History  . Professor     [employer]   Social History Main Topics  . Smoking status: Light Tobacco Smoker -- 0.25 packs/day for 20 years    Types: Cigarettes  . Smokeless tobacco: Former Neurosurgeon    Types: Chew     Comment:  1-2 cig per week  . Alcohol Use: 0.0 oz/week    0 Standard drinks or equivalent per week     Comment: rarely  . Drug Use: No  . Sexual Activity: Not Asked   Other Topics Concern  . 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  aspirin 81 MG tablet, Take 81 mg by mouth daily., Disp: , Rfl:  .  glucose blood (ACCU-CHEK SMARTVIEW) test strip, Use as directed to check blood sugar., Disp: 100 each, Rfl: 3 .  lisinopril (PRINIVIL,ZESTRIL) 2.5 MG tablet, TAKE 1 TABLET (2.5 MG TOTAL) BY MOUTH DAILY., Disp: 90 tablet, Rfl: 3 .  metFORMIN (GLUCOPHAGE) 1000 MG tablet, Take 1 tablet (1,000 mg total) by mouth 2 (two) times daily with a meal., Disp: 180 tablet, Rfl: 3 .  sitaGLIPtin (JANUVIA) 100 MG tablet, TAKE 1 TABLET (100 MG TOTAL) BY MOUTH DAILY., Disp: 90 tablet, Rfl: 3 .  ACCU-CHEK FASTCLIX LANCETS MISC, Test twice daily., Disp: 102 each, Rfl: 5  EXAM:  Filed Vitals:   09/18/14 1525  BP: 136/90  Pulse: 89  Temp: 98.6 F (37 C)    Body mass index is 27.98 kg/(m^2).  GENERAL: vitals reviewed and listed above, alert, oriented, appears well hydrated and in no acute distress  HEENT: atraumatic, conjunttiva clear, no obvious abnormalities on inspection  of external nose and ears  NECK: no obvious masses on inspection  LUNGS: clear to auscultation bilaterally, no wheezes, rales or rhonchi, good air movement  CV: HRRR, no peripheral edema  MS: moves all extremities without noticeable abnormality  PSYCH: pleasant and cooperative, no obvious depression or anxiety  ASSESSMENT AND PLAN:  Discussed the following assessment and plan:  Type 2 diabetes, uncontrolled, with renal manifestation - Plan: metFORMIN (GLUCOPHAGE) 1000 MG tablet, Hemoglobin A1C  Lipid disorder  Obstructive sleep apnea  -Patient advised to return or notify a doctor immediately if symptoms worsen or persist or new concerns arise.  Patient Instructions  BEFORE YOU LEAVE: -labs -schedule physical exam in November - please come fasting  Please call  today to schedule your diabetic eye exam - call one of the opthomology offices from the list we provided  Please call Dr. Larae Grooms office about settle up your liver biopsy and follow up  Please quit smoking   We recommend the following healthy lifestyle measures: - eat a healthy diet consisting small portions of vegetables, fruits, beans, nuts, seeds, healthy meats such as white chicken and fish and whole grains.  - avoid sweets, starches, fried foods, fast food, processed foods, sodas, red meet and other fattening foods.  - get a least 150 minutes of aerobic exercise per week.       Kriste Basque R.

## 2014-09-18 NOTE — Patient Instructions (Signed)
BEFORE YOU LEAVE: -labs -schedule physical exam in November - please come fasting  Please call today to schedule your diabetic eye exam - call one of the opthomology offices from the list we provided  Please call Dr. Larae Grooms office about settle up your liver biopsy and follow up  Please quit smoking   We recommend the following healthy lifestyle measures: - eat a healthy diet consisting small portions of vegetables, fruits, beans, nuts, seeds, healthy meats such as white chicken and fish and whole grains.  - avoid sweets, starches, fried foods, fast food, processed foods, sodas, red meet and other fattening foods.  - get a least 150 minutes of aerobic exercise per week.

## 2014-09-19 ENCOUNTER — Other Ambulatory Visit: Payer: Self-pay | Admitting: Family Medicine

## 2015-01-25 ENCOUNTER — Encounter: Payer: Self-pay | Admitting: Family Medicine

## 2015-01-25 ENCOUNTER — Ambulatory Visit (INDEPENDENT_AMBULATORY_CARE_PROVIDER_SITE_OTHER): Payer: BC Managed Care – PPO | Admitting: Family Medicine

## 2015-01-25 VITALS — BP 110/78 | HR 72 | Temp 97.6°F | Ht 72.0 in | Wt 224.6 lb

## 2015-01-25 DIAGNOSIS — Z0000 Encounter for general adult medical examination without abnormal findings: Secondary | ICD-10-CM | POA: Diagnosis not present

## 2015-01-25 DIAGNOSIS — E789 Disorder of lipoprotein metabolism, unspecified: Secondary | ICD-10-CM

## 2015-01-25 DIAGNOSIS — E1165 Type 2 diabetes mellitus with hyperglycemia: Secondary | ICD-10-CM

## 2015-01-25 DIAGNOSIS — R7989 Other specified abnormal findings of blood chemistry: Secondary | ICD-10-CM | POA: Diagnosis not present

## 2015-01-25 DIAGNOSIS — E1129 Type 2 diabetes mellitus with other diabetic kidney complication: Secondary | ICD-10-CM

## 2015-01-25 DIAGNOSIS — R740 Nonspecific elevation of levels of transaminase and lactic acid dehydrogenase [LDH]: Secondary | ICD-10-CM | POA: Diagnosis not present

## 2015-01-25 DIAGNOSIS — IMO0002 Reserved for concepts with insufficient information to code with codable children: Secondary | ICD-10-CM

## 2015-01-25 DIAGNOSIS — R7401 Elevation of levels of liver transaminase levels: Secondary | ICD-10-CM

## 2015-01-25 DIAGNOSIS — K529 Noninfective gastroenteritis and colitis, unspecified: Secondary | ICD-10-CM

## 2015-01-25 LAB — COMPREHENSIVE METABOLIC PANEL
ALBUMIN: 4.6 g/dL (ref 3.5–5.2)
ALT: 80 U/L — ABNORMAL HIGH (ref 0–53)
AST: 46 U/L — AB (ref 0–37)
Alkaline Phosphatase: 64 U/L (ref 39–117)
BUN: 19 mg/dL (ref 6–23)
CALCIUM: 10 mg/dL (ref 8.4–10.5)
CHLORIDE: 99 meq/L (ref 96–112)
CO2: 32 meq/L (ref 19–32)
CREATININE: 0.97 mg/dL (ref 0.40–1.50)
GFR: 88.24 mL/min (ref >60.00)
Glucose, Bld: 207 mg/dL — ABNORMAL HIGH (ref 70–99)
POTASSIUM: 4.8 meq/L (ref 3.5–5.1)
Sodium: 140 mEq/L (ref 135–145)
Total Bilirubin: 0.7 mg/dL (ref 0.2–1.2)
Total Protein: 7.2 g/dL (ref 6.0–8.3)

## 2015-01-25 LAB — LIPID PANEL
CHOLESTEROL: 139 mg/dL (ref 0–200)
HDL: 29.2 mg/dL — AB (ref >39.00)
Total CHOL/HDL Ratio: 5

## 2015-01-25 LAB — HEMOGLOBIN A1C: Hgb A1c MFr Bld: 7.9 % — ABNORMAL HIGH (ref 4.6–6.5)

## 2015-01-25 LAB — FERRITIN: FERRITIN: 681.4 ng/mL — AB (ref 22.0–322.0)

## 2015-01-25 LAB — LDL CHOLESTEROL, DIRECT: LDL DIRECT: 55 mg/dL

## 2015-01-25 NOTE — Progress Notes (Signed)
HPI:  David Grant is a 44 yo [place] M, poorly compliant with follow up and recommendations, here for a CPE:  -Concerns and/or follow up today:   Diabetes Mellitis: -meds: metformin 1000mg  bid, januvia 50mg  daily, asa, acei -complications: none -referred to diabetes educator and have strongly advised a healthy low carb diet and regular exercise -last eye exam: has not seen the opthomologist -reports fasting BS around 120-130s -reports: no exercise, diet not great -denies: polyuria, vision changes, wounds on feet, vision changes  HLD: -in statin  Transaminitis: -undergoing eval with GI -has elevated ferritin and is considering liver biopsy -genetic testing negative  -he drank alcohol last night, reports hardly drinks  OSA/Smoking: - 1 pack cigerettes per week  - using cpap  Diarrhea: -started about 1 week ago -mild diarrhea and abd pain at times -no vomiting, hematochezia, melena, fevers -abd pain now resolved -at cheese and drank wine last night and symptoms recurred  -Diabetes and Dyslipidemia Screening: today FASTING  -Vaccines: refused flu  -sexual activity: yes, male partner, no new partners  -wants STI testing, Hep C screening (if born [id number]): no  -FH colon or prstate ca: see FH Last colon cancer screening: n/a Last prostate ca screening: n/a  -Alcohol, Tobacco, drug use: see social history  Review of Systems - no fevers, unintentional weight loss, vision loss, hearing loss, chest pain, sob, hemoptysis, melena, hematochezia, hematuria, genital discharge, changing or concerning skin lesions, bleeding, bruising, loc, thoughts of self harm or SI  Past Medical History  Diagnosis Date  . Diabetes mellitus without complication (HCC)   . Hyperlipidemia   . OSA on CPAP   . Type 2 diabetes, uncontrolled, with renal manifestation (HCC) 04/09/2013    Past Surgical History  Procedure Laterality Date  . No past surgeries      Family History  Problem  Relation Age of Onset  . Bone cancer Father   . Diabetes Father   . Hypertension Father   . Colon cancer Neg Hx   . Colon polyps Neg Hx   . Kidney disease Neg Hx   . Esophageal cancer Neg Hx   . Heart disease Neg Hx     Social History   Social History  . Marital Status: Married    Spouse Name: N/A  . Number of Children: 2  . Years of Education: N/A   Occupational History  . Professor     [employer]   Social History Main Topics  . Smoking status: Light Tobacco Smoker -- 0.25 packs/day for 20 years    Types: Cigarettes  . Smokeless tobacco: Former NeurosurgeonUser    Types: Chew     Comment:  1-2 cig per week  . Alcohol Use: 0.0 oz/week    0 Standard drinks or equivalent per week     Comment: rarely  . Drug Use: No  . Sexual Activity: Not Asked   Other Topics Concern  . None   Social History Narrative   Work or School: Publishing copyresearch statistic instructor      Home Situation: lives with wife and child      Spiritual Beliefs: Christian      Lifestyle: no exercise, diet ok              Current outpatient prescriptions:  .  ACCU-CHEK FASTCLIX LANCETS MISC, Test twice daily., Disp: 102 each, Rfl: 5 .  aspirin 81 MG tablet, Take 81 mg by mouth daily., Disp: , Rfl:  .  glucose blood (ACCU-CHEK SMARTVIEW) test  strip, Use as directed to check blood sugar., Disp: 100 each, Rfl: 3 .  lisinopril (PRINIVIL,ZESTRIL) 2.5 MG tablet, TAKE 1 TABLET (2.5 MG TOTAL) BY MOUTH DAILY., Disp: 90 tablet, Rfl: 3 .  metFORMIN (GLUCOPHAGE) 1000 MG tablet, Take 1 tablet (1,000 mg total) by mouth 2 (two) times daily with a meal., Disp: 180 tablet, Rfl: 3 .  pravastatin (PRAVACHOL) 40 MG tablet, TAKE 1 TABLET (40 MG TOTAL) BY MOUTH DAILY., Disp: 90 tablet, Rfl: 1 .  sitaGLIPtin (JANUVIA) 100 MG tablet, TAKE 1 TABLET (100 MG TOTAL) BY MOUTH DAILY., Disp: 90 tablet, Rfl: 3  EXAM:  Filed Vitals:   01/25/15 0839  BP: 110/78  Pulse: 72  Temp: 97.6 F (36.4 C)  TempSrc: Oral  Height: 6' (1.829  m)  Weight: 224 lb 9.6 oz (101.878 kg)    Estimated body mass index is 30.45 kg/(m^2) as calculated from the following:   Height as of this encounter: 6' (1.829 m).   Weight as of this encounter: 224 lb 9.6 oz (101.878 kg).  GENERAL: vitals reviewed and listed below, alert, oriented, appears well hydrated and in no acute distress  HEENT: head atraumatic, PERRLA, normal appearance of eyes, ears, nose and mouth. moist mucus membranes.  NECK: supple, no masses or lymphadenopathy  LUNGS: clear to auscultation bilaterally, no rales, rhonchi or wheeze  CV: HRRR, no peripheral edema or cyanosis, normal pedal pulses  ABDOMEN: bowel sounds normal, soft, non tender to palpation, no masses, no rebound or guarding  GU: declined  SKIN: no rash or abnormal lesions - declined full skin exam  MS: normal gait, moves all extremities normally  NEURO: CN II-XII grossly intact, normal muscle strength and sensation to light touch on extremities  PSYCH: normal affect, pleasant and cooperative  ASSESSMENT AND PLAN:  Discussed the following assessment and plan:  Visit for preventive health examination  Lipid disorder - Plan: Lipid Panel  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  -Advised at least 150 minutes of exercise per week and a healthy diet low in saturated fats and sweets and consisting of fresh fruits and vegetables, lean meats such as fish and white chicken and whole grains.  -labs, studies and vaccines per orders this encounter   Patient advised to return to clinic immediately if symptoms worsen or persist or new concerns.  Patient Instructions  BEFORE YOU  LEAVE: -labs -follow up in 4-6 months  Please call today schedule follow up with the Gastroenterologist regarding the iron and liver enzymes.  Please call today to schedule your diabetic eye exam  Please do not use alcohol which is hard on the liver  Please do not smoke   -We have ordered labs or studies at this visit. It can take up to 1-2 weeks for results and processing. We will contact you with instructions IF your results are abnormal. Normal results will be released to your [facility]. If you have not heard from [place] or can not find your results in [facility] in 2 weeks please contact our office.  We recommend the following healthy lifestyle measures: - eat a healthy whole foods diet consisting of regular small meals composed of vegetables, fruits, beans, nuts, seeds, healthy meats such as white  chicken and fish and whole grains.  - avoid sweets, white starchy foods, fried foods, fast food, processed foods, sodas, red meet and other fattening foods.  - get a least 150-300 minutes of aerobic exercise per week.    No dairy for 1 week  prilosec  daily for 1 week  Imodium if needed for diarrhea  Follow up if stomach issues not resolved in 1 week       No Follow-up on file.   Kriste Basque R.

## 2015-01-25 NOTE — Patient Instructions (Addendum)
BEFORE YOU LEAVE: -labs -follow up in 4-6 months  Please call today schedule follow up with the Gastroenterologist regarding the iron and liver enzymes.  Please call today to schedule your diabetic eye exam  Please do not use alcohol which is hard on the liver  Please do not smoke   -We have ordered labs or studies at this visit. It can take up to 1-2 weeks for results and processing. We will contact you with instructions IF your results are abnormal. Normal results will be [facility] to your [facility]. If you have not heard from us or can not find your results in [facility] in 2 weeks please contact our office.  We recommend the following healthy lifestyle measures: - eat a healthy whole foods diet consisting of regular small meals composed of vegetables, fruits, beans, nuts, seeds, healthy meats such as white chicken and fish and whole grains.  - avoid sweets, white starchy foods, fried foods, fast food, processed foods, sodas, red meet and other fattening foods.  - get a least 150-300 minutes of aerobic exercise per week.    No dairy for 1 week  prilosec 20mg  daily for 1 week  Imodium if needed for diarrhea  Follow up if stomach issues not resolved in 1 week

## 2015-01-25 NOTE — Progress Notes (Signed)
Pre visit review using our clinic review tool, if applicable. No additional management support is needed unless otherwise documented below in the visit note. 

## 2015-01-29 ENCOUNTER — Encounter: Payer: BC Managed Care – PPO | Admitting: Family Medicine

## 2015-03-12 ENCOUNTER — Other Ambulatory Visit: Payer: Self-pay | Admitting: Family Medicine

## 2015-03-19 ENCOUNTER — Ambulatory Visit (INDEPENDENT_AMBULATORY_CARE_PROVIDER_SITE_OTHER): Payer: BC Managed Care – PPO | Admitting: Family Medicine

## 2015-03-19 ENCOUNTER — Encounter: Payer: Self-pay | Admitting: Family Medicine

## 2015-03-19 VITALS — BP 110/80 | HR 84 | Temp 98.3°F | Ht 72.0 in | Wt 222.6 lb

## 2015-03-19 DIAGNOSIS — E1165 Type 2 diabetes mellitus with hyperglycemia: Secondary | ICD-10-CM

## 2015-03-19 DIAGNOSIS — R740 Nonspecific elevation of levels of transaminase and lactic acid dehydrogenase [LDH]: Secondary | ICD-10-CM

## 2015-03-19 DIAGNOSIS — E789 Disorder of lipoprotein metabolism, unspecified: Secondary | ICD-10-CM

## 2015-03-19 DIAGNOSIS — E1129 Type 2 diabetes mellitus with other diabetic kidney complication: Secondary | ICD-10-CM

## 2015-03-19 DIAGNOSIS — IMO0002 Reserved for concepts with insufficient information to code with codable children: Secondary | ICD-10-CM

## 2015-03-19 DIAGNOSIS — R7401 Elevation of levels of liver transaminase levels: Secondary | ICD-10-CM

## 2015-03-19 MED ORDER — GLIPIZIDE 5 MG PO TABS: 5.0000 mg | ORAL_TABLET | Freq: Two times a day (BID) | ORAL | Status: DC

## 2015-03-19 NOTE — Patient Instructions (Addendum)
BEFORE YOU LEAVE: -schedule follow up in 3 months  Call today to schedule follow up with your Gastroenterologist  Start  Glipizide  Continue your other medications  We recommend the following healthy lifestyle measures: - eat a healthy whole foods diet consisting of regular small meals composed of vegetables, fruits, beans, nuts, seeds, healthy meats such as white chicken and fish and whole grains.  - avoid sweets, white starchy foods, fried foods, fast food, processed foods, sodas, red meet and other fattening foods.  - get a least 150-300 minutes of aerobic exercise per week.

## 2015-03-19 NOTE — Progress Notes (Signed)
Pre visit review using our clinic review tool, if applicable. No additional management support is needed unless otherwise documented below in the visit note. 

## 2015-03-19 NOTE — Progress Notes (Signed)
HPI:  David Grant is a pleasant 45 yo M with PMH DM, HLD, Transaminitis (seeing GI), OSA and poor compliance here for an acute visit for to discuss his worsening diabetes. He reports he has a degree in understanding the metabolic factors regarding diabetes and know diet is the issue but is reluctant to change. His wife does the cooking and he enjoys sweets. He has not contacted his gastroenterologist.   ROS: See pertinent positives and negatives per HPI.  Past Medical History  Diagnosis Date  . Diabetes mellitus without complication (HCC)   . Hyperlipidemia   . OSA on CPAP   . Type 2 diabetes, uncontrolled, with renal manifestation (HCC) 04/09/2013    Past Surgical History  Procedure Laterality Date  . No past surgeries      Family History  Problem Relation Age of Onset  . Bone cancer Father   . Diabetes Father   . Hypertension Father   . Colon cancer Neg Hx   . Colon polyps Neg Hx   . Kidney disease Neg Hx   . Esophageal cancer Neg Hx   . Heart disease Neg Hx     Social History   Social History  . Marital Status: Married    Spouse Name: N/A  . Number of Children: 2  . Years of Education: N/A   Occupational History  . Professor     [employer]   Social History Main Topics  . Smoking status: Light Tobacco Smoker -- 0.25 packs/day for 20 years    Types: Cigarettes  . Smokeless tobacco: Former Neurosurgeon    Types: Chew     Comment:  1-2 cig per week  . Alcohol Use: 0.0 oz/week    0 Standard drinks or equivalent per week     Comment: rarely  . Drug Use: No  . Sexual Activity: Not Asked   Other Topics Concern  . 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  ACCU-CHEK FASTCLIX LANCETS MISC, Test twice daily., Disp: 102 each, Rfl: 5 .  aspirin 81  MG tablet, Take 81 mg by mouth daily., Disp: , Rfl:  .  glucose blood (ACCU-CHEK SMARTVIEW) test strip, Use as directed to check blood sugar., Disp: 100 each, Rfl: 3 .  lisinopril (PRINIVIL,ZESTRIL) 2.5 MG tablet, TAKE 1 TABLET BY MOUTH ONCE A DAY, Disp: 90 tablet, Rfl: 0 .  metFORMIN (GLUCOPHAGE) 1000 MG tablet, Take 1 tablet (1,000 mg total) by mouth 2 (two) times daily with a meal., Disp: 180 tablet, Rfl: 3 .  pravastatin (PRAVACHOL) 40 MG tablet, TAKE 1 TABLET (40 MG TOTAL) BY MOUTH DAILY., Disp: 90 tablet, Rfl: 0 .  sitaGLIPtin (JANUVIA) 100 MG tablet, TAKE 1 TABLET (100 MG TOTAL) BY MOUTH DAILY., Disp: 90 tablet, Rfl: 3 .  glipiZIDE (GLUCOTROL) 5 MG tablet, Take 1 tablet (5 mg total) by mouth 2 (two) times daily before a meal., Disp: 60 tablet, Rfl: 3  EXAM:  Filed Vitals:   03/19/15 1454  BP: 110/80  Pulse: 84  Temp: 98.3 F (36.8 C)    Body mass index is 30.18 kg/(m^2).  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  -discussed medication options and opted to add glipizide  Transaminitis -advised to follow up with GI, number provided, he agreed.  -Patient advised to return or notify a doctor immediately if  symptoms worsen or persist or new concerns arise.  Patient Instructions  BEFORE YOU LEAVE: -schedule follow up in 3 months  Call today to schedule follow up with your Gastroenterologist  Start  Glipizide  Continue your other medications  We recommend the following healthy lifestyle measures: - eat a healthy whole foods diet consisting of regular small meals composed of vegetables, fruits, beans, nuts, seeds, healthy meats such as white chicken and fish and whole grains.  - avoid sweets, white starchy foods, fried foods, fast food, processed foods, sodas, red meet and other fattening foods.  - get a least 150-300 minutes of aerobic exercise per week.        Kriste Basque R.

## 2015-06-22 ENCOUNTER — Other Ambulatory Visit: Payer: Self-pay | Admitting: Family Medicine

## 2015-06-28 ENCOUNTER — Ambulatory Visit (INDEPENDENT_AMBULATORY_CARE_PROVIDER_SITE_OTHER): Payer: BC Managed Care – PPO | Admitting: Family Medicine

## 2015-06-28 ENCOUNTER — Encounter: Payer: Self-pay | Admitting: Family Medicine

## 2015-06-28 VITALS — BP 118/82 | HR 77 | Temp 98.2°F | Ht 72.0 in | Wt 225.0 lb

## 2015-06-28 DIAGNOSIS — G4733 Obstructive sleep apnea (adult) (pediatric): Secondary | ICD-10-CM

## 2015-06-28 DIAGNOSIS — E1165 Type 2 diabetes mellitus with hyperglycemia: Secondary | ICD-10-CM | POA: Diagnosis not present

## 2015-06-28 DIAGNOSIS — R740 Nonspecific elevation of levels of transaminase and lactic acid dehydrogenase [LDH]: Secondary | ICD-10-CM | POA: Diagnosis not present

## 2015-06-28 DIAGNOSIS — IMO0002 Reserved for concepts with insufficient information to code with codable children: Secondary | ICD-10-CM

## 2015-06-28 DIAGNOSIS — E789 Disorder of lipoprotein metabolism, unspecified: Secondary | ICD-10-CM | POA: Diagnosis not present

## 2015-06-28 DIAGNOSIS — R7401 Elevation of levels of liver transaminase levels: Secondary | ICD-10-CM

## 2015-06-28 DIAGNOSIS — E1129 Type 2 diabetes mellitus with other diabetic kidney complication: Secondary | ICD-10-CM

## 2015-06-28 HISTORY — DX: Elevation of levels of liver transaminase levels: R74.01

## 2015-06-28 LAB — LIPID PANEL
CHOL/HDL RATIO: 5
Cholesterol: 119 mg/dL (ref 0–200)
HDL: 24.5 mg/dL — AB (ref >39.00)
NONHDL: 94.77
TRIGLYCERIDES: 333 mg/dL — AB (ref 0.0–149.0)
VLDL: 66.6 mg/dL — AB (ref 0.0–40.0)

## 2015-06-28 LAB — COMPREHENSIVE METABOLIC PANEL
ALBUMIN: 4.7 g/dL (ref 3.5–5.2)
ALT: 80 U/L — ABNORMAL HIGH (ref 0–53)
AST: 43 U/L — ABNORMAL HIGH (ref 0–37)
Alkaline Phosphatase: 51 U/L (ref 39–117)
BUN: 13 mg/dL (ref 6–23)
CALCIUM: 9.7 mg/dL (ref 8.4–10.5)
CHLORIDE: 102 meq/L (ref 96–112)
CO2: 29 meq/L (ref 19–32)
Creatinine, Ser: 0.94 mg/dL (ref 0.40–1.50)
GFR: 91.33 mL/min (ref >60.00)
Glucose, Bld: 130 mg/dL — ABNORMAL HIGH (ref 70–99)
Potassium: 4.4 mEq/L (ref 3.5–5.1)
Sodium: 137 mEq/L (ref 135–145)
Total Bilirubin: 0.9 mg/dL (ref 0.2–1.2)
Total Protein: 7.3 g/dL (ref 6.0–8.3)

## 2015-06-28 LAB — HEMOGLOBIN A1C: Hgb A1c MFr Bld: 7.2 % — ABNORMAL HIGH (ref 4.6–6.5)

## 2015-06-28 LAB — LDL CHOLESTEROL, DIRECT: LDL DIRECT: 45 mg/dL

## 2015-06-28 MED ORDER — LISINOPRIL 2.5 MG PO TABS: 2.5000 mg | ORAL_TABLET | Freq: Every day | ORAL | Status: DC

## 2015-06-28 MED ORDER — ACCU-CHEK FASTCLIX LANCETS MISC: Status: DC

## 2015-06-28 NOTE — Progress Notes (Signed)
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  Diabetes Mellitis: -meds: metformin 1072m bid, januvia 1026mdaily, asa, acei -complications: none -referred to diabetes educator and have strongly advised a healthy low carb diet and regular exercise - he refused to see diabetes educator and admitted he doesn't want to change diet; reports diet not really better, no regular exercise -last eye exam: out of date, agrees to schedule -home BS: n/a -diet and exercise: eats a lot of sweets -denies: polyuria, vision changes, wounds on feet, vision changes  HLD: -prevastatin 40 mg  OSA/Smoking: - 1 pack cigerettes per week  - using cpap  Transaminitis: -undergoing eval with GI, reports he did not follow up but agrees to call, reports was busy -occ difuse bloating sensation in abd -denies: abd pain, nauase, vomiting, CP, SOB, DOE, skin rash -has elevated ferritin and is considering liver biopsy -genetic testing negative    ROS: See pertinent positives and negatives per HPI.  Past Medical History  Diagnosis Date  . Diabetes mellitus without complication ([place]  . Hyperlipidemia   . OSA on CPAP     Past Surgical History  Procedure Laterality Date  . No past surgeries      Family History  Problem Relation Age of Onset  . Bone cancer Father   . Diabetes Father   . Hypertension Father   . Colon cancer Neg Hx   . Colon polyps Neg Hx   . Kidney disease Neg Hx   . Esophageal cancer Neg Hx   . Heart disease Neg Hx     Social History   Social History  . Marital Status: Married    Spouse Name: N/A  . Number of Children: 2  . Years of Education: N/A   Occupational History  . Professor     [employer] Social History Main Topics  . Smoking status: Light Tobacco Smoker -- 0.25 packs/day for 20 years    Types: Cigarettes  . Smokeless  tobacco: Former [employer] developer  Types: Chew     Comment:  1-2 cig per week  . Alcohol Use: 0.0 oz/week    0 Standard drinks or equivalent per week     Comment: rarely  . Drug Use: No  . Sexual Activity: Not Asked   Other Topics Concern  . 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  ACCU-CHEK FASTCLIX LANCETS MISC, Test twice daily., Disp: 102 each, Rfl: 0 .  aspirin 81 MG tablet, Take 81 mg by mouth daily., Disp: , Rfl:  .  glipiZIDE (GLUCOTROL) 5 MG tablet, Take 1 tablet (5 mg total) by mouth 2 (two) times daily before a meal., Disp: 60 tablet, Rfl: 3 .  glucose blood (ACCU-CHEK SMARTVIEW) test strip, Use as directed to check blood sugar., Disp: 100 each, Rfl: 3 .  lisinopril (PRINIVIL,ZESTRIL) 2.5 MG tablet, Take 1 tablet (2.5 mg total) by mouth daily., Disp: 90 tablet, Rfl: 3 .  metFORMIN (GLUCOPHAGE) 1000 MG tablet, Take 1 tablet (1,000 mg total) by mouth 2 (two) times daily with a meal., Disp:  180 tablet, Rfl: 3 .  pravastatin (PRAVACHOL) 40 MG tablet, TAKE 1 TABLET (40 MG TOTAL) BY MOUTH DAILY., Disp: 90 tablet, Rfl: 1 .  sitaGLIPtin (JANUVIA) 100 MG tablet, TAKE 1 TABLET (100 MG TOTAL) BY MOUTH DAILY., Disp: 90 tablet, Rfl: 3  EXAM:  Filed Vitals:   06/28/15 0926  BP: 118/82  Pulse: 77  Temp: 98.2 F (36.8 C)    Body mass index is 30.51 kg/(m^2).  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 Unfortunately, I can not go to his house and ensure he follows these recommendations, I can not control what he puts in his mouth, how he exercises or ensure he follow up for his appointments. I will continue to try to help him the best I can, but did advised my goal is to help him achieve good health. -after discussion tx options for DM, he is agreeable to adding basal insulin if needed after rechecking labs -lifestyle recs -foot exam done -advised eye exam -advised again that he follow up with GI, number provided for him to call, stressed importance  -Patient advised to return or notify a doctor immediately if symptoms worsen or persist or new concerns arise.  Patient Instructions  BEFORE YOU LEAVE: -labs -schedule follow up in 3-4 months  I am very concerned that you did not follow up with your gastroenterologist about your liver and iron abnormalities. This could potentially be serious and I recommend that you call you gastroenterologist today to set up follow up. Avoid alcohol.  I also am very concerned about your diabetes. We will recheck labs today.  We may need to add insulin. A healthy diet and regular exercise are the best treatment for diabetes, along with exercise.  Call to set up your diabetic eye exam.  We recommend the following healthy lifestyle measures: - eat a healthy whole  foods diet consisting of regular small meals composed of vegetables, fruits, beans, nuts, seeds, healthy meats such as white chicken and fish and whole grains.  - avoid sweets, hidden sugars (rice, potatoes, corn, chips, pretzels, bananas, dried fruit, canned fruit, soda, sweet tea, etc), fried foods, fast food, processed foods, sodas, red meet.  - get a least 150-300 minutes of aerobic exercise per week.   We have ordered labs or studies at this visit. It can take up to 1-2 weeks for results and processing. IF results require follow up or explanation, we will call you with instructions. Clinically stable results will be released to your [facility]. If you have not heard from [place] or cannot find your results in [facility] in 2 weeks please contact our office at [phone].  If you  are not yet signed up for [facility], please consider signing up.             Colin Benton R.

## 2015-06-28 NOTE — Progress Notes (Signed)
Pre visit review using our clinic review tool, if applicable. No additional management support is needed unless otherwise documented below in the visit note. 

## 2015-06-28 NOTE — Patient Instructions (Signed)
BEFORE YOU LEAVE: -labs -schedule follow up in 3-4 months  I am very concerned that you did not follow up with your gastroenterologist about your liver and iron abnormalities. This could potentially be serious and I recommend that you call you gastroenterologist today to set up follow up. Avoid alcohol.  I also am very concerned about your diabetes. We will recheck labs today.  We may need to add insulin. A healthy diet and regular exercise are the best treatment for diabetes, along with exercise.  Call to set up your diabetic eye exam.  We recommend the following healthy lifestyle measures: - eat a healthy whole foods diet consisting of regular small meals composed of vegetables, fruits, beans, nuts, seeds, healthy meats such as white chicken and fish and whole grains.  - avoid sweets, hidden sugars (rice, potatoes, corn, chips, pretzels, bananas, dried fruit, canned fruit, soda, sweet tea, etc), fried foods, fast food, processed foods, sodas, red meet.  - get a least 150-300 minutes of aerobic exercise per week.   We have ordered labs or studies at this visit. It can take up to 1-2 weeks for results and processing. IF results require follow up or explanation, we will call you with instructions. Clinically stable results will be released to your [facility]. If you have not heard from us or cannot find your results in [facility] in 2 weeks please contact our office at [phone].  If you are not yet signed up for [facility], please consider signing up.

## 2015-06-29 MED ORDER — GLIPIZIDE 10 MG PO TABS: 10.0000 mg | ORAL_TABLET | Freq: Two times a day (BID) | ORAL | Status: DC

## 2015-06-29 NOTE — Addendum Note (Signed)
Addended by: Sallee LangeFUNDERBURK, Arma Reining A on: 06/29/2015 11:00 AM   Modules accepted: Orders, Medications

## 2015-06-30 ENCOUNTER — Telehealth: Payer: Self-pay | Admitting: Gastroenterology

## 2015-06-30 NOTE — Telephone Encounter (Signed)
5/24 Dr. Gerilyn PilgrimJacob patient - [facility]  Nothing available until August with MD; advise on urgency.

## 2015-06-30 NOTE — Telephone Encounter (Signed)
It is not urgent, he can wait till my next rov.

## 2015-06-30 NOTE — Telephone Encounter (Signed)
Dr Christella HartiganJacobs the pt was seen 05/2014 and has not followed up with any recommendations, he is non compliant with DM and did not have liver biopsy as recommended.  His PCP wants him to follow up with GI for transaminitis.  Your first available is not until August.  Can he wait or see extender?

## 2015-07-06 ENCOUNTER — Telehealth: Payer: Self-pay | Admitting: Gastroenterology

## 2015-07-06 NOTE — Telephone Encounter (Signed)
A user error has taken place.

## 2015-08-16 ENCOUNTER — Other Ambulatory Visit: Payer: Self-pay | Admitting: Family Medicine

## 2015-09-03 ENCOUNTER — Encounter: Payer: Self-pay | Admitting: Family Medicine

## 2015-10-14 ENCOUNTER — Encounter: Payer: Self-pay | Admitting: Family Medicine

## 2015-10-14 ENCOUNTER — Ambulatory Visit (INDEPENDENT_AMBULATORY_CARE_PROVIDER_SITE_OTHER): Payer: BC Managed Care – PPO | Admitting: Family Medicine

## 2015-10-14 VITALS — BP 120/80 | HR 96 | Temp 97.9°F | Ht 72.0 in | Wt 224.0 lb

## 2015-10-14 DIAGNOSIS — Z720 Tobacco use: Secondary | ICD-10-CM | POA: Diagnosis not present

## 2015-10-14 DIAGNOSIS — R740 Nonspecific elevation of levels of transaminase and lactic acid dehydrogenase [LDH]: Secondary | ICD-10-CM

## 2015-10-14 DIAGNOSIS — Z91199 Patient's noncompliance with other medical treatment and regimen due to unspecified reason: Secondary | ICD-10-CM

## 2015-10-14 DIAGNOSIS — R7401 Elevation of levels of liver transaminase levels: Secondary | ICD-10-CM

## 2015-10-14 DIAGNOSIS — IMO0002 Reserved for concepts with insufficient information to code with codable children: Secondary | ICD-10-CM

## 2015-10-14 DIAGNOSIS — E1129 Type 2 diabetes mellitus with other diabetic kidney complication: Secondary | ICD-10-CM | POA: Diagnosis not present

## 2015-10-14 DIAGNOSIS — E789 Disorder of lipoprotein metabolism, unspecified: Secondary | ICD-10-CM

## 2015-10-14 DIAGNOSIS — Z9119 Patient's noncompliance with other medical treatment and regimen: Secondary | ICD-10-CM | POA: Diagnosis not present

## 2015-10-14 DIAGNOSIS — E1165 Type 2 diabetes mellitus with hyperglycemia: Secondary | ICD-10-CM

## 2015-10-14 NOTE — Addendum Note (Signed)
Addended by: Baldwin CrownJOHNSON, Angelynn Lemus D on: 10/14/2015 04:34 PM   Modules accepted: Orders

## 2015-10-14 NOTE — Progress Notes (Signed)
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  Diabetes Mellitis: -meds: metformin 1031m bid, glipizide 10bid, januvia 102mdaily, asa, acei -complications: none -referred to diabetes educator and have strongly advised a healthy low carb diet and regular exercise - he refused to see diabetes educator and admitted he doesn't want to change diet/no reg ecercise -last eye exam: out of date, agrees to schedule -home BS: n/a -denies: polyuria, vision changes, wounds on feet, vision changes  HLD: -pravastatin 40 mg  OSA/Smoking: - 1 pack cigerettes per week  -have advised to quit and offered help on many occassions -continues to smoke - using cpap  Transaminitis: -undergoing eval with GI, poor compliance with their recs and follow up - I have advised him many times that his condition is potentially serious and have advised him to follow up with GI which he repetitively reports he will do then has not - reports they called him but he was busy -occ difuse bloating sensation in abd, occ LLQ abd pain, belching -denies: vomiting, CP, SOB, DOE, skin rash -has elevated ferritin and is considering liver biopsy -genetic testing negative  -today reports hx transaminitis in his 20s  ROS: See pertinent positives and negatives per HPI.  Past Medical History:  Diagnosis Date  . Diabetes mellitus without complication ([place]  . Hyperlipidemia   . OSA on CPAP     Past Surgical History:  Procedure Laterality Date  . NO PAST SURGERIES      Family History  Problem Relation Age of Onset  . Bone cancer Father   . Diabetes Father   . Hypertension Father   . Colon cancer Neg Hx   . Colon polyps Neg Hx   . Kidney disease Neg Hx   . Esophageal cancer Neg Hx   . Heart disease Neg Hx     Social History   Social History  . Marital status: Married   Spouse name: N/A  . Number of children: 2  . Years of education: N/A   Occupational History  . Professor     [employer] Social History Main Topics  . Smoking status: Light Tobacco Smoker    Packs/day: 0.25    Years: 20.00    Types: Cigarettes  . Smokeless tobacco: Former [employer] developer  Types: Chew     Comment:  1-2 cig per week  . Alcohol use 0.0 oz/week     Comment: rarely  . Drug use: No  . Sexual activity: Not Asked   Other Topics Concern  . 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  ACCU-CHEK FASTCLIX LANCETS MISC, Test twice daily., Disp: 102 each, Rfl: 0 .  aspirin 81 MG tablet, Take 81 mg by mouth daily., Disp: , Rfl:  .  glipiZIDE (GLUCOTROL) 10 MG tablet, Take 1 tablet (10 mg total) by mouth 2 (two) times daily before a meal., Disp: 60 tablet, Rfl: 3 .  glucose blood (ACCU-CHEK SMARTVIEW) test strip, Use as directed to check blood sugar., Disp: 100 each, Rfl: 3 .  JANUVIA 100 MG tablet, TAKE  1 TABLET BY MOUTH DAILY, Disp: 90 tablet, Rfl: 1 .  lisinopril (PRINIVIL,ZESTRIL) 2.5 MG tablet, Take 1 tablet (2.5 mg total) by mouth daily., Disp: 90 tablet, Rfl: 3 .  metFORMIN (GLUCOPHAGE) 1000 MG tablet, Take 1 tablet (1,000 mg total) by mouth 2 (two) times daily with a meal., Disp: 180 tablet, Rfl: 3 .  pravastatin (PRAVACHOL) 40 MG tablet, TAKE 1 TABLET (40 MG TOTAL) BY MOUTH DAILY., Disp: 90 tablet, Rfl: 1  EXAM:  Vitals:   10/14/15 1549  BP: 120/80  Pulse: 96  Temp: 97.9 F (36.6 C)    Body mass index is 30.38 kg/m.  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  Patient Instructions  BEFORE YOU LEAVE: -follow up: 4 months -labs  Call you gastroenterologist today for an appointment. Let us know if we can help you in arranging follow up with your gastroenterologist regarding the liver condition and stomach issues.  We have ordered labs or studies at this visit. It can take up to 1-2 weeks for results and processing. IF results require follow up or explanation, we will call you with instructions. Clinically stable results will be released to your [facility]. If you have not heard from [place] or cannot find your results in [facility] in 2 weeks please contact our office at [phone].  If you are not yet signed up for [facility], please consider signing up.    We recommend the following healthy lifestyle for LIFE: 1) Small portions.   Tip: eat off of a salad plate instead of a dinner plate.  Tip: It is ok to feel hungry after a meal - that likely means you ate an appropriate portion.  Tip: if you need more or a snack choose fruits, veggies and/or a handful of nuts or  seeds.  2) Eat a healthy clean diet.  * Tip: Avoid (less then 1 serving per week): processed foods, sweets, sweetened drinks, white starches (rice, flour, bread, potatoes, pasta, etc), red meat, fast foods, butter  *Tip: CHOOSE instead   * 5-9 servings per day of fresh or frozen fruits and vegetables (but not corn, potatoes, bananas, canned or dried fruit)   *nuts and seeds, beans   *olives and olive oil   *small portions of lean meats such as fish and white chicken    *small portions of whole grains  3)Get at least 150 minutes of sweaty aerobic exercise per week.  4)Reduce stress - consider counseling, meditation and relaxation to balance other aspects of  your life.     Colin Benton R., DO

## 2015-10-14 NOTE — Patient Instructions (Signed)
BEFORE YOU LEAVE: -follow up: 4 months -labs  Call you gastroenterologist today for an appointment. Let us know if we can help you in arranging follow up with your gastroenterologist regarding the liver condition and stomach issues.  We have ordered labs or studies at this visit. It can take up to 1-2 weeks for results and processing. IF results require follow up or explanation, we will call you with instructions. Clinically stable results will be [facility]. If you have not heard from us or cannot find your results in [facility] in 2 weeks please contact our office at [phone].  If you are not yet signed up for [facility], please consider signing up.    We recommend the following healthy lifestyle for LIFE: 1) Small portions.   Tip: eat off of a salad plate instead of a dinner plate.  Tip: It is ok to feel hungry after a meal - that likely means you ate an appropriate portion.  Tip: if you need more or a snack choose fruits, veggies and/or a handful of nuts or seeds.  2) Eat a healthy clean diet.  * Tip: Avoid (less then 1 serving per week): processed foods, sweets, sweetened drinks, white starches (rice, flour, bread, potatoes, pasta, etc), red meat, fast foods, butter  *Tip: CHOOSE instead   * 5-9 servings per day of fresh or frozen fruits and vegetables (but not corn, potatoes, bananas, canned or dried fruit)   *nuts and seeds, beans   *olives and olive oil   *small portions of lean meats such as fish and white chicken    *small portions of whole grains  3)Get at least 150 minutes of sweaty aerobic exercise per week.  4)Reduce stress - consider counseling, meditation and relaxation to balance other aspects of your life.

## 2015-10-15 ENCOUNTER — Other Ambulatory Visit: Payer: Self-pay | Admitting: Family Medicine

## 2015-10-15 LAB — CBC
HCT: 51.7 % (ref 39.0–52.0)
Hemoglobin: 18 g/dL (ref 13.0–17.0)
MCHC: 34.8 g/dL (ref 30.0–36.0)
MCV: 85.9 fl (ref 78.0–100.0)
Platelets: 154 10*3/uL (ref 150.0–400.0)
RBC: 6.03 Mil/uL — ABNORMAL HIGH (ref 4.22–5.81)
RDW: 13.6 % (ref 11.5–15.5)
WBC: 8.3 10*3/uL (ref 4.0–10.5)

## 2015-10-15 LAB — COMPREHENSIVE METABOLIC PANEL
ALT: 108 U/L — ABNORMAL HIGH (ref 0–53)
AST: 68 U/L — AB (ref 0–37)
Albumin: 4.9 g/dL (ref 3.5–5.2)
Alkaline Phosphatase: 60 U/L (ref 39–117)
BUN: 13 mg/dL (ref 6–23)
CALCIUM: 9.5 mg/dL (ref 8.4–10.5)
CHLORIDE: 100 meq/L (ref 96–112)
CO2: 25 meq/L (ref 19–32)
Creatinine, Ser: 0.98 mg/dL (ref 0.40–1.50)
GFR: 86.93 mL/min (ref >60.00)
Glucose, Bld: 87 mg/dL (ref 70–99)
POTASSIUM: 4 meq/L (ref 3.5–5.1)
Sodium: 138 mEq/L (ref 135–145)
Total Bilirubin: 0.6 mg/dL (ref 0.2–1.2)
Total Protein: 7.9 g/dL (ref 6.0–8.3)

## 2015-10-15 LAB — HEMOGLOBIN A1C: HEMOGLOBIN A1C: 7.6 % — AB (ref 4.6–6.5)

## 2015-10-15 MED ORDER — BASAGLAR KWIKPEN 100 UNIT/ML ~~LOC~~ SOPN: 10.0000 [IU] | PEN_INJECTOR | Freq: Every day | SUBCUTANEOUS | 3 refills | Status: DC

## 2015-10-15 NOTE — Progress Notes (Signed)
LM for pt to return call and discuss results/plan with Ronnald CollumJo Anne or Marcelino DusterMichelle - see results note.

## 2015-10-18 ENCOUNTER — Telehealth: Payer: Self-pay | Admitting: Gastroenterology

## 2015-10-18 NOTE — Addendum Note (Signed)
Addended by: Johnella MoloneyFUNDERBURK, JO A on: 10/18/2015 10:09 AM   Modules accepted: Orders

## 2015-10-18 NOTE — Telephone Encounter (Signed)
The pt is advised to keep appt with Doug SouJessica Zehr and a decision will be made at that time regarding what is needed

## 2015-10-18 NOTE — Addendum Note (Signed)
Addended by: Johnella MoloneyFUNDERBURK, JO A on: 10/18/2015 02:32 PM   Modules accepted: Orders

## 2015-10-28 ENCOUNTER — Ambulatory Visit (INDEPENDENT_AMBULATORY_CARE_PROVIDER_SITE_OTHER): Payer: BC Managed Care – PPO | Admitting: Gastroenterology

## 2015-10-28 ENCOUNTER — Other Ambulatory Visit (INDEPENDENT_AMBULATORY_CARE_PROVIDER_SITE_OTHER): Payer: BC Managed Care – PPO

## 2015-10-28 ENCOUNTER — Encounter: Payer: Self-pay | Admitting: Gastroenterology

## 2015-10-28 VITALS — BP 108/78 | HR 76 | Ht 72.0 in | Wt 223.1 lb

## 2015-10-28 DIAGNOSIS — R945 Abnormal results of liver function studies: Principal | ICD-10-CM

## 2015-10-28 DIAGNOSIS — R790 Abnormal level of blood mineral: Secondary | ICD-10-CM

## 2015-10-28 DIAGNOSIS — R7989 Other specified abnormal findings of blood chemistry: Secondary | ICD-10-CM | POA: Diagnosis not present

## 2015-10-28 DIAGNOSIS — R109 Unspecified abdominal pain: Secondary | ICD-10-CM

## 2015-10-28 HISTORY — DX: Abnormal level of blood mineral: R79.0

## 2015-10-28 LAB — IBC PANEL
IRON: 125 ug/dL (ref 42–165)
SATURATION RATIOS: 33.4 % (ref 20.0–50.0)
Transferrin: 267 mg/dL (ref 212.0–360.0)

## 2015-10-28 LAB — FERRITIN: Ferritin: 809.3 ng/mL — ABNORMAL HIGH (ref 22.0–322.0)

## 2015-10-28 NOTE — Patient Instructions (Addendum)
You have been given a separate informational sheet regarding your tobacco use, the importance of quitting and local resources to help you quit.  Please go to the basement level to have your labs drawn.   [facility]1st [facility] of hospital) on Thursday 11-04-2015  at 8:30 am. Please arrive at 8:15 to your appointment for registration. Make certain not to have anything to eat or drink 6 hours prior to your appointment. Should you need to reschedule your appointment, please contact radiology at [phone]. This test typically takes about 30 minutes to perform.  The radiology department will be calling you with the appointment for the Liver biopsy.

## 2015-10-28 NOTE — Progress Notes (Signed)
     10/28/2015 David Grant [id number] 19-May-1970  Review of pertinent gastrointestinal problems: 1. Elevated liver tests; Transaminases chronically elevated;  [place] as well, he believes they felt it was from lipids (perhaps fatty liver?).  Labs 02/2014: Ferritin 658, Transferrin saturation 59%, DNA testing indicates that this individual is negative for the C282Y and H63D mutations in the HFE gene. Hepatitis B surface antibody positive, hepatitis B surface antigen negative, hepatitis A antibodies negative, hepatitis C antibody negative, celiac sprue testing negative, AMA, AMA, anti-smooth muscle antibody were all negative, ceruloplasmin negative.  Abdominal ultrasound 02/2014 suggested no cirrhosis but underlying steatosis.    History of Present Illness:  Here to follow up on his elevated liver enzymes and increased iron levels after 1.5 years.  Never had liver biopsy so would like to reschedule that.  Liver enzymes earlier this month showing normal alkaline phosphatase and a normal total bilirubin. AST 68 and ALT 108. This is slightly elevated as compared to May of this year, but they have been elevated to this degree in the past.  Today he complains of left flank pain that somewhat goes into the left side of his abdomen. Seems to be worse with bending. Has been present for several months. Doesn't think it is affected by eating or bowel habits.  According to recent PCP notes, it appears that he has been noncompliant with a lot of his medical care.  Current Medications, Allergies, Past Medical History, Past Surgical History, Family History and Social History were [facility].   Physical Exam: BP 108/78   Pulse 76   Ht 6' (1.829 m)   Wt 223 lb 2 oz (101.2 kg)   BMI 30.26 kg/m  General: Well developed [place] male in no acute distress Head: Normocephalic and atraumatic Eyes:  sclerae anicteric, conjunctiva pink  Ears: Normal auditory  acuity Lungs: Clear throughout to auscultation Heart: Regular rate and rhythm Abdomen: Soft, non-distended.  Normal bowel sounds.  Non-tender.  Slightly tender over left flank area. Musculoskeletal: Symmetrical with no gross deformities  Extremities: No edema  Neurological: Alert oriented x 4, grossly non-focal Psychological:  Alert and cooperative. Normal mood and affect  Assessment and Recommendations: -45 y.o. male with chronically elevated transaminases, elevated ferritin and elevated transferrin saturation.  Here today after a year and a half to reschedule liver biopsy.  Will schedule.  Will also recheck iron studies.  LFT's remain elevated but stable to where they were in the past. -Left flank to LUQ abdominal pain:  Worse with bending.  I explained that it may be musculoskeletal with referred pain to his abdomen.  Will check abdominal ultrasound but I have asked him to re-discuss with his PCP as well for possible treatment of musculoskeletal source.

## 2015-10-29 NOTE — Progress Notes (Signed)
I agree with the above note, plan 

## 2015-11-04 ENCOUNTER — Ambulatory Visit (HOSPITAL_COMMUNITY)
Admission: RE | Admit: 2015-11-04 | Discharge: 2015-11-04 | Disposition: A | Discharge status: Home / Self Care | Payer: BC Managed Care – PPO | Source: Ambulatory Visit | Attending: Gastroenterology | Admitting: Gastroenterology

## 2015-11-04 ENCOUNTER — Other Ambulatory Visit: Payer: Self-pay | Admitting: Radiology

## 2015-11-04 DIAGNOSIS — R109 Unspecified abdominal pain: Secondary | ICD-10-CM | POA: Insufficient documentation

## 2015-11-04 DIAGNOSIS — R7989 Other specified abnormal findings of blood chemistry: Secondary | ICD-10-CM | POA: Diagnosis present

## 2015-11-04 DIAGNOSIS — R932 Abnormal findings on diagnostic imaging of liver and biliary tract: Secondary | ICD-10-CM | POA: Diagnosis not present

## 2015-11-04 DIAGNOSIS — R945 Abnormal results of liver function studies: Secondary | ICD-10-CM

## 2015-11-04 DIAGNOSIS — R790 Abnormal level of blood mineral: Secondary | ICD-10-CM | POA: Diagnosis not present

## 2015-11-05 ENCOUNTER — Other Ambulatory Visit: Payer: Self-pay | Admitting: Family Medicine

## 2015-11-05 DIAGNOSIS — E1129 Type 2 diabetes mellitus with other diabetic kidney complication: Secondary | ICD-10-CM

## 2015-11-05 DIAGNOSIS — E1165 Type 2 diabetes mellitus with hyperglycemia: Principal | ICD-10-CM

## 2015-11-05 DIAGNOSIS — IMO0002 Reserved for concepts with insufficient information to code with codable children: Secondary | ICD-10-CM

## 2015-11-05 NOTE — Telephone Encounter (Signed)
I left a detailed message at the pts cell number with the information below. 

## 2015-11-05 NOTE — Telephone Encounter (Signed)
We had advised stopping this in light of liver issues for now and poor control. He can discuss further with endo when he sees them.

## 2015-11-08 ENCOUNTER — Other Ambulatory Visit: Payer: Self-pay | Admitting: Radiology

## 2015-11-09 ENCOUNTER — Ambulatory Visit (HOSPITAL_COMMUNITY)
Admission: RE | Admit: 2015-11-09 | Discharge: 2015-11-09 | Disposition: A | Discharge status: Home / Self Care | Payer: BC Managed Care – PPO | Source: Ambulatory Visit | Attending: Gastroenterology | Admitting: Gastroenterology

## 2015-11-09 ENCOUNTER — Encounter (HOSPITAL_COMMUNITY): Payer: Self-pay

## 2015-11-09 DIAGNOSIS — Z794 Long term (current) use of insulin: Secondary | ICD-10-CM | POA: Diagnosis not present

## 2015-11-09 DIAGNOSIS — K760 Fatty (change of) liver, not elsewhere classified: Secondary | ICD-10-CM | POA: Insufficient documentation

## 2015-11-09 DIAGNOSIS — R7989 Other specified abnormal findings of blood chemistry: Secondary | ICD-10-CM | POA: Diagnosis not present

## 2015-11-09 DIAGNOSIS — R945 Abnormal results of liver function studies: Secondary | ICD-10-CM

## 2015-11-09 DIAGNOSIS — G4733 Obstructive sleep apnea (adult) (pediatric): Secondary | ICD-10-CM | POA: Insufficient documentation

## 2015-11-09 DIAGNOSIS — Z7982 Long term (current) use of aspirin: Secondary | ICD-10-CM | POA: Diagnosis not present

## 2015-11-09 DIAGNOSIS — E119 Type 2 diabetes mellitus without complications: Secondary | ICD-10-CM | POA: Insufficient documentation

## 2015-11-09 DIAGNOSIS — E785 Hyperlipidemia, unspecified: Secondary | ICD-10-CM | POA: Diagnosis not present

## 2015-11-09 DIAGNOSIS — F17210 Nicotine dependence, cigarettes, uncomplicated: Secondary | ICD-10-CM | POA: Insufficient documentation

## 2015-11-09 DIAGNOSIS — R790 Abnormal level of blood mineral: Secondary | ICD-10-CM

## 2015-11-09 DIAGNOSIS — R109 Unspecified abdominal pain: Secondary | ICD-10-CM | POA: Diagnosis present

## 2015-11-09 LAB — COMPREHENSIVE METABOLIC PANEL
ALK PHOS: 59 U/L (ref 38–126)
ALT: 110 U/L — AB (ref 17–63)
AST: 69 U/L — AB (ref 15–41)
Albumin: 4.9 g/dL (ref 3.5–5.0)
Anion gap: 9 (ref 5–15)
BUN: 12 mg/dL (ref 6–20)
CALCIUM: 9.6 mg/dL (ref 8.9–10.3)
CHLORIDE: 103 mmol/L (ref 101–111)
CO2: 26 mmol/L (ref 22–32)
CREATININE: 0.86 mg/dL (ref 0.61–1.24)
Glucose, Bld: 131 mg/dL — ABNORMAL HIGH (ref 65–99)
Potassium: 4.1 mmol/L (ref 3.5–5.1)
SODIUM: 138 mmol/L (ref 135–145)
Total Bilirubin: 1.3 mg/dL — ABNORMAL HIGH (ref 0.3–1.2)
Total Protein: 8.1 g/dL (ref 6.5–8.1)

## 2015-11-09 LAB — CBC WITH DIFFERENTIAL/PLATELET
BASOS ABS: 0 10*3/uL (ref 0.0–0.1)
BASOS PCT: 0 %
EOS ABS: 0.2 10*3/uL (ref 0.0–0.7)
Eosinophils Relative: 3 %
HCT: 50.1 % (ref 39.0–52.0)
HEMOGLOBIN: 17.6 g/dL — AB (ref 13.0–17.0)
Lymphocytes Relative: 45 %
Lymphs Abs: 3.2 10*3/uL (ref 0.7–4.0)
MCH: 30.2 pg (ref 26.0–34.0)
MCHC: 35.1 g/dL (ref 30.0–36.0)
MCV: 86.1 fL (ref 78.0–100.0)
Monocytes Absolute: 0.6 10*3/uL (ref 0.1–1.0)
Monocytes Relative: 9 %
NEUTROS PCT: 43 %
Neutro Abs: 3.1 10*3/uL (ref 1.7–7.7)
PLATELETS: 141 10*3/uL — AB (ref 150–400)
RBC: 5.82 MIL/uL — AB (ref 4.22–5.81)
RDW: 13.2 % (ref 11.5–15.5)
WBC: 7.1 10*3/uL (ref 4.0–10.5)

## 2015-11-09 LAB — GLUCOSE, CAPILLARY: Glucose-Capillary: 134 mg/dL — ABNORMAL HIGH (ref 65–99)

## 2015-11-09 LAB — PROTIME-INR
INR: 0.99
Prothrombin Time: 13.1 seconds (ref 11.4–15.2)

## 2015-11-09 MED ORDER — SODIUM CHLORIDE 0.9 % IV SOLN
INTRAVENOUS | Status: DC
  Administered 2015-11-09: 11:00:00 via INTRAVENOUS

## 2015-11-09 MED ORDER — FENTANYL CITRATE (PF) 100 MCG/2ML IJ SOLN
INTRAMUSCULAR | Status: AC | PRN
  Administered 2015-11-09: 50 ug via INTRAVENOUS
  Administered 2015-11-09 (×2): 25 ug via INTRAVENOUS

## 2015-11-09 MED ORDER — NALOXONE HCL 0.4 MG/ML IJ SOLN
INTRAMUSCULAR | Status: AC
  Filled 2015-11-09: qty 1

## 2015-11-09 MED ORDER — MIDAZOLAM HCL 2 MG/2ML IJ SOLN
INTRAMUSCULAR | Status: AC | PRN
  Administered 2015-11-09: 1 mg via INTRAVENOUS
  Administered 2015-11-09: 0.5 mg via INTRAVENOUS

## 2015-11-09 MED ORDER — FENTANYL CITRATE (PF) 100 MCG/2ML IJ SOLN
INTRAMUSCULAR | Status: AC
  Filled 2015-11-09: qty 2

## 2015-11-09 MED ORDER — MIDAZOLAM HCL 2 MG/2ML IJ SOLN
INTRAMUSCULAR | Status: AC
  Filled 2015-11-09: qty 2

## 2015-11-09 MED ORDER — FLUMAZENIL 0.5 MG/5ML IV SOLN
INTRAVENOUS | Status: AC
  Filled 2015-11-09: qty 5

## 2015-11-09 NOTE — Consult Note (Signed)
Chief Complaint: Patient was seen in consultation today for US guided random core liver biopsy  Referring Physician(s): Jacobs,D  Supervising Physician: Jolaine Click  Patient Status: Outpatient  History of Present Illness: David Grant is a 45 y.o. male smoker with history of diabetes, hyperlipidemia, OSA on CPAP , fatty liver and chronically elevated transaminases with elevated ferritin /transferrin saturation who presents today for ultrasound-guided random core liver biopsy for further evaluation.  Past Medical History:  Diagnosis Date  . Diabetes mellitus without complication (HCC)   . Hyperlipidemia   . OSA on CPAP     Past Surgical History:  Procedure Laterality Date  . NO PAST SURGERIES      Allergies: Shellfish allergy  Medications: Prior to Admission medications   Medication Sig Start Date End Date Taking? Authorizing Provider  ACCU-CHEK FASTCLIX LANCETS MISC Test twice daily. 06/28/15   Terressa Koyanagi, DO  aspirin 81 MG tablet Take 81 mg by mouth daily.    Historical Provider, MD  glucose blood (ACCU-CHEK SMARTVIEW) test strip Use as directed to check blood sugar. 12/15/13   Terressa Koyanagi, DO  Insulin Glargine (BASAGLAR KWIKPEN) 100 UNIT/ML SOPN Inject 0.1 mLs (10 Units total) into the skin at bedtime. 10/15/15   Terressa Koyanagi, DO  JANUVIA 100 MG tablet TAKE 1 TABLET BY MOUTH DAILY 08/16/15   Terressa Koyanagi, DO  lisinopril (PRINIVIL,ZESTRIL) 2.5 MG tablet Take 1 tablet (2.5 mg total) by mouth daily. 06/28/15   Terressa Koyanagi, DO  pravastatin (PRAVACHOL) 40 MG tablet TAKE 1 TABLET (40 MG TOTAL) BY MOUTH DAILY. 06/22/15   Terressa Koyanagi, DO     Family History  Problem Relation Age of Onset  . Bone cancer Father   . Diabetes Father   . Hypertension Father   . Colon cancer Neg Hx   . Colon polyps Neg Hx   . Kidney disease Neg Hx   . Esophageal cancer Neg Hx   . Heart disease Neg Hx     Social History   Social History  . Marital status: Married    Spouse name: N/A  .  Number of children: 2  . Years of education: N/A   Occupational History  . Professor     [employer]   Social History Main Topics  . Smoking status: Light Tobacco Smoker    Packs/day: 0.25    Years: 20.00    Types: Cigarettes  . Smokeless tobacco: Former Neurosurgeon    Types: Chew     Comment:  1-2 cig per week  . Alcohol use 0.0 oz/week     Comment: rarely  . Drug use: No  . Sexual activity: Not on file   Other Topics Concern  . 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 He does have some intermittent abdominal/left flank discomfort Vital Signs: BP 122/83 (BP Location: Right Arm)   Pulse 74   Temp 98.2 F (36.8 C) (Oral)   Resp 18   SpO2 100%   Physical Exam awake, alert. Chest clear to auscultation bilaterally. Heart with regular  rate and rhythm. Abdomen soft, positive bowel sounds, some mild diffuse tenderness. Lower extremities- no edema.  Mallampati Score:     Imaging: [place] Abdomen Complete  Result Date: 11/04/2015 CLINICAL DATA:  Elevated liver enzymes.  Left flank pain EXAM: ABDOMEN ULTRASOUND COMPLETE COMPARISON:  February 10, 2014 FINDINGS: Gallbladder: No gallstones or wall thickening visualized. There is no pericholecystic fluid. No sonographic Murphy sign noted by sonographer. Common bile duct: Diameter: 6 mm. There is no intrahepatic, common hepatic, or common bile duct dilatation. Liver: No focal lesion identified. Liver echogenicity is coarsened, stable. IVC: No abnormality visualized. Pancreas: Visualized portion unremarkable. Portions of the pancreatic tail are obscured by gas. Spleen: Size and appearance within normal limits. Right Kidney:  Length: 13.0 cm. Echogenicity within normal limits. No mass or hydronephrosis visualized. Left Kidney: Length: 13.3 cm. Echogenicity within normal limits. No mass or hydronephrosis visualized. Abdominal aorta: No aneurysm visualized. Other findings: No demonstrable ascites. IMPRESSION: Liver echogenicity remains coarsened, likely due to underlying parenchymal liver disease. While no focal liver lesions are identified, it must be cautioned that the sensitivity of ultrasound for focal liver lesions that circumstance is diminished. Portions of pancreas obscured by gas. Visualized portions of pancreas appear unremarkable. Study otherwise unremarkable. Electronically Signed   By: Bretta BangWilliam  Woodruff III M.D.   On: 11/04/2015 09:10    Labs:  CBC:  Recent Labs  10/14/15 1634  WBC 8.3  HGB 18.0 Repeated and verified X2.*  HCT 51.7  PLT 154.0    COAGS: No results for input(s): INR, APTT in the last 8760 hours.  BMP:  Recent Labs  01/25/15 0911 06/28/15 1007 10/14/15 1634  NA 140 137 138  K 4.8 4.4 4.0  CL 99 102 100  CO2 32 29 25  GLUCOSE 207* 130* 87  BUN 19 13 13   CALCIUM 10.0 9.7 9.5  CREATININE 0.97 0.94 0.98    LIVER FUNCTION TESTS:  Recent Labs  01/25/15 0911 06/28/15 1007 10/14/15 1634  BILITOT 0.7 0.9 0.6  AST 46* 43* 68*  ALT 80* 80* 108*  ALKPHOS 64 51 60  PROT 7.2 7.3 7.9  ALBUMIN 4.6 4.7 4.9    TUMOR MARKERS: No results for input(s): AFPTM, CEA, CA199, CHROMGRNA in the last 8760 hours.  Assessment and Plan: 45 y.o. male smoker with history of diabetes, hyperlipidemia, OSA on CPAP , fatty liver and chronically elevated transaminases with elevated ferritin /transferrin saturation who presents today for ultrasound-guided random core liver biopsy for further evaluation.Risks and benefits discussed with the patient/wife including, but not limited to bleeding, infection, damage to adjacent structures or low yield requiring additional tests. All of the patient's  questions were answered, patient is agreeable to proceed.Consent signed and in chart.Labs pending.       Thank you for this interesting consult.  I greatly enjoyed meeting Kailo Selena BattenKim and look forward to participating in their care.  A copy of this report was sent to the requesting provider on this date.  Electronically Signed: D. Jeananne RamaKevin Allred 11/09/2015, 11:13 AM   I spent a total of  25 minutes   in face to face in clinical consultation, greater than 50% of which was counseling/coordinating care for US guided random core liver biopsy

## 2015-11-09 NOTE — Discharge Instructions (Signed)
Moderate Conscious Sedation, Adult, Care After  Refer to this sheet in the next few weeks. These instructions provide you with information on caring for yourself after your procedure. Your health care provider may also give you more specific instructions. Your treatment has been planned according to current medical practices, but problems sometimes occur. Call your health care provider if you have any problems or questions after your procedure.  WHAT TO EXPECT AFTER THE PROCEDURE   After your procedure:   You may feel sleepy, clumsy, and have poor balance for several hours.   Vomiting may occur if you eat too soon after the procedure.  HOME CARE INSTRUCTIONS   Do not participate in any activities where you could become injured for at least 24 hours. Do not:    Drive.    Swim.    Ride a bicycle.    Operate heavy machinery.    Cook.    Use power tools.    Climb ladders.    Work from a high place.   Do not make important decisions or sign legal documents until you are improved.   If you vomit, drink water, juice, or soup when you can drink without vomiting. Make sure you have little or no nausea before eating solid foods.   Only take over-the-counter or prescription medicines for pain, discomfort, or fever as directed by your health care provider.   Make sure you and your family fully understand everything about the medicines given to you, including what side effects may occur.   You should not drink alcohol, take sleeping pills, or take medicines that cause drowsiness for at least 24 hours.   If you smoke, do not smoke without supervision.   If you are feeling better, you may resume normal activities 24 hours after you were sedated.   Keep all appointments with your health care provider.  SEEK MEDICAL CARE IF:   Your skin is pale or bluish in color.   You continue to feel nauseous or vomit.   Your pain is getting worse and is not helped by medicine.   You have bleeding or swelling.   You are still  sleepy or feeling clumsy after 24 hours.  SEEK IMMEDIATE MEDICAL CARE IF:   You develop a rash.   You have difficulty breathing.   You develop any type of allergic problem.   You have a fever.  MAKE SURE YOU:   Understand these instructions.   Will watch your condition.   Will get help right away if you are not doing well or get worse.     This information is not intended to replace advice given to you by your health care provider. Make sure you discuss any questions you have with your health care provider.     Document Released: 11/13/2012 Document Revised: 02/13/2014 Document Reviewed: 11/13/2012  Elsevier Interactive Patient Education 2016 Elsevier Inc.      Liver Biopsy, Care After  Refer to this sheet in the next few weeks. These instructions provide you with information on caring for yourself after your procedure. Your health care provider may also give you more specific instructions. Your treatment has been planned according to current medical practices, but problems sometimes occur. Call your health care provider if you have any problems or questions after your procedure.  WHAT TO EXPECT AFTER THE PROCEDURE  After your procedure, it is typical to have the following:   A small amount of discomfort in the area where the biopsy was done   and in the right shoulder or shoulder blade.   A small amount of bruising around the area where the biopsy was done and on the skin over the liver.   Sleepiness and fatigue for the rest of the day.  HOME CARE INSTRUCTIONS    Rest at home for 1-2 days or as directed by your health care provider.   Have a friend or family member stay with you for at least 24 hours.   Because of the medicines used during the procedure, you should not do the following things in the first 24 hours:    Drive.    Use machinery.    Be responsible for the care of other people.    Sign legal documents.    Take a bath or shower.   There are many different ways to close and cover an incision,  including stitches, skin glue, and adhesive strips. Follow your health care provider's instructions on:    Incision care.    Bandage (dressing) changes and removal.    Incision closure removal.   Do not drink alcohol in the first week.   Do not lift more than 5 pounds or play contact sports for 2 weeks after this test.   Take medicines only as directed by your health care provider. Do not take medicine containing aspirin or non-steroidal anti-inflammatory medicines such as ibuprofen for 1 week after this test.   It is your responsibility to get your test results.  SEEK MEDICAL CARE IF:    You have increased bleeding from an incision that results in more than a small spot of blood.   You have redness, swelling, or increasing pain in any incisions.   You notice a discharge or a bad smell coming from any of your incisions.   You have a fever or chills.  SEEK IMMEDIATE MEDICAL CARE IF:    You develop swelling, bloating, or pain in your abdomen.   You become dizzy or faint.   You develop a rash.   You are nauseous or vomit.   You have difficulty breathing, feel short of breath, or feel faint.   You develop chest pain.   You have problems with your speech or vision.   You have trouble balancing or moving your arms or legs.     This information is not intended to replace advice given to you by your health care provider. Make sure you discuss any questions you have with your health care provider.     Document Released: 08/12/2004 Document Revised: 02/13/2014 Document Reviewed: 03/21/2013  Elsevier Interactive Patient Education 2016 Elsevier Inc.

## 2015-11-09 NOTE — Procedures (Signed)
Random liver biopsy No comp/EBL 

## 2015-11-11 ENCOUNTER — Ambulatory Visit (INDEPENDENT_AMBULATORY_CARE_PROVIDER_SITE_OTHER): Payer: BC Managed Care – PPO | Admitting: Endocrinology

## 2015-11-11 ENCOUNTER — Encounter: Payer: Self-pay | Admitting: Endocrinology

## 2015-11-11 VITALS — BP 126/82 | HR 86 | Ht 72.0 in | Wt 223.0 lb

## 2015-11-11 DIAGNOSIS — E1129 Type 2 diabetes mellitus with other diabetic kidney complication: Secondary | ICD-10-CM

## 2015-11-11 DIAGNOSIS — E1165 Type 2 diabetes mellitus with hyperglycemia: Secondary | ICD-10-CM | POA: Diagnosis not present

## 2015-11-11 DIAGNOSIS — R740 Nonspecific elevation of levels of transaminase and lactic acid dehydrogenase [LDH]: Secondary | ICD-10-CM | POA: Diagnosis not present

## 2015-11-11 DIAGNOSIS — IMO0002 Reserved for concepts with insufficient information to code with codable children: Secondary | ICD-10-CM

## 2015-11-11 DIAGNOSIS — R7401 Elevation of levels of liver transaminase levels: Secondary | ICD-10-CM

## 2015-11-11 MED ORDER — PIOGLITAZONE HCL 45 MG PO TABS: 45.0000 mg | ORAL_TABLET | Freq: Every day | ORAL | 3 refills | Status: DC

## 2015-11-11 NOTE — Patient Instructions (Addendum)
good diet and exercise significantly improve the control of your diabetes.  please let me know if you wish to be referred to a dietician.  high blood sugar is very risky to your health.  you should see an eye doctor and dentist every year.  It is very important to get all recommended vaccinations.  Controlling your blood pressure and cholesterol drastically reduces the damage diabetes does to your body.  Those who smoke should quit.  Please discuss these with your doctor.  check your blood sugar once a day.  vary the time of day when you check, between before the 3 meals, and at bedtime.  also check if you have symptoms of your blood sugar being too high or too low.  please keep a record of the readings and bring it to your next appointment here (or you can bring the meter itself).  You can write it on any piece of paper.  please call us sooner if your blood sugar goes below 70, or if you have a lot of readings over 200.   I have sent a prescription to your pharmacy, to add "pioglitizone."  Please do the blood tests in 3 months.  I would be happy to see you back here as needed.

## 2015-11-11 NOTE — Progress Notes (Signed)
Subjective:    Patient ID: David Grant, male    DOB: 1970/05/31, 45 y.o.   MRN: [id number]  HPI pt states DM was dx'ed in 2011; he has mild if any neuropathy of the lower extremities; he is unaware of any associated chronic complications; he has never been on insulin; pt says his diet and exercise are not good; he has never had pancreatitis, severe hypoglycemia or DKA. Past Medical History:  Diagnosis Date  . Diabetes mellitus without complication (HCC)   . Hyperlipidemia   . OSA on CPAP     Past Surgical History:  Procedure Laterality Date  . NO PAST SURGERIES      Social History   Social History  . Marital status: Married    Spouse name: N/A  . Number of children: 2  . Years of education: N/A   Occupational History  . Professor     [employer]   Social History Main Topics  . Smoking status: Light Tobacco Smoker    Packs/day: 0.25    Years: 20.00    Types: Cigarettes  . Smokeless tobacco: Former NeurosurgeonUser    Types: Chew     Comment:  1-2 cig per week  . Alcohol use 0.0 oz/week     Comment: rarely  . Drug use: No  . Sexual activity: Not on file   Other Topics Concern  . 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 ACCU-CHEK FASTCLIX LANCETS MISC Test twice daily. 102 each 0  . aspirin 81 MG tablet Take 81 mg by mouth daily.    . cholecalciferol (VITAMIN D) 400 units TABS tablet Take 400 Units by mouth.    . Coenzyme Q10 (CO Q 10) 100 MG CAPS Take 100 mg by mouth.    Marland Kitchen. glucose blood (ACCU-CHEK SMARTVIEW) test strip Use as directed to check blood sugar. 100 each 3  . JANUVIA 100 MG tablet TAKE 1 TABLET BY MOUTH DAILY 90 tablet 1  . lisinopril (PRINIVIL,ZESTRIL) 2.5 MG tablet Take 1 tablet  (2.5 mg total) by mouth daily. 90 tablet 3  . Omega-3 Fatty Acids (FISH OIL) 1200 MG CPDR Take 1,200 mg by mouth.    . pravastatin (PRAVACHOL) 40 MG tablet TAKE 1 TABLET (40 MG TOTAL) BY MOUTH DAILY. 90 tablet 1   No current facility-administered medications on file prior to visit.     Allergies  Allergen Reactions  . Shellfish Allergy     Family History  Problem Relation Age of Onset  . Bone cancer Father   . Diabetes Father   . Hypertension Father   . Colon cancer Neg Hx   . Colon polyps Neg Hx   . Kidney disease Neg Hx   . Esophageal cancer Neg Hx   . Heart disease Neg Hx     BP 126/82   Pulse 86   Ht 6' (1.829 m)   Wt 223 lb (101.2 kg)   SpO2 95%   BMI 30.24 kg/m    Review of Systems denies weight loss, blurry vision, headache, chest pain, sob, n/v, urinary frequency, muscle cramps, excessive diaphoresis, depression, cold intolerance,  rhinorrhea, and easy bruising.       Objective:   Physical Exam VS: see vs page GEN: no distress HEAD: head: no deformity eyes: no periorbital swelling, no proptosis external nose and ears are normal mouth: no lesion seen NECK: supple, thyroid is not enlarged CHEST WALL: no deformity LUNGS: clear to auscultation CV: reg rate and rhythm, no murmur ABD: abdomen is soft, nontender.  no hepatosplenomegaly.  not distended.  no hernia MUSCULOSKELETAL: muscle bulk and strength are grossly normal.  no obvious joint swelling.  gait is normal and steady EXTEMITIES: no deformity.  no ulcer on the feet.  feet are of normal color and temp.  no edema PULSES: dorsalis pedis intact bilat.  no carotid bruit NEURO:  cn 2-12 grossly intact.   readily moves all 4's.  sensation is intact to touch on the feet SKIN:  Normal texture and temperature.  No rash or suspicious lesion is visible.   NODES:  None palpable at the neck PSYCH: alert, well-oriented.  Does not appear anxious nor depressed.   Lab Results  Component Value Date   HGBA1C 7.6 (H)  10/14/2015   Lab Results  Component Value Date   CREATININE 0.86 11/09/2015   BUN 12 11/09/2015   NA 138 11/09/2015   K 4.1 11/09/2015   CL 103 11/09/2015   CO2 26 11/09/2015   I have reviewed outside records, and summarized: Pt was noted to have elevated a1c, and referred here.  He was also noted to have elevated hepatic transaminases, for uncertain reason.     Assessment & Plan:  Type 2 DM: we discussed rx options: he declines insulin.  Instead, he wants to add more oral rx. elev hepatic transaminases: new to me: in this setting, pioglitizone is best drug to add.  Patient is advised the following: Patient Instructions  good diet and exercise significantly improve the control of your diabetes.  please let me know if you wish to be referred to a dietician.  high blood sugar is very risky to your health.  you should see an eye doctor and dentist every year.  It is very important to get all recommended vaccinations.  Controlling your blood pressure and cholesterol drastically reduces the damage diabetes does to your body.  Those who smoke should quit.  Please discuss these with your doctor.  check your blood sugar once a day.  vary the time of day when you check, between before the 3 meals, and at bedtime.  also check if you have symptoms of your blood sugar being too high or too low.  please keep a record of the readings and bring it to your next appointment here (or you can bring the meter itself).  You can write it on any piece of paper.  please call us sooner if your blood sugar goes below 70, or if you have a lot of readings over 200.   I have sent a prescription to your pharmacy, to add "pioglitizone."  Please do the blood tests in 3 months.  I would be happy to see you back here as needed.

## 2015-11-13 ENCOUNTER — Encounter: Payer: Self-pay | Admitting: Family Medicine

## 2015-11-15 ENCOUNTER — Ambulatory Visit (INDEPENDENT_AMBULATORY_CARE_PROVIDER_SITE_OTHER): Payer: BC Managed Care – PPO | Admitting: Family Medicine

## 2015-11-15 ENCOUNTER — Encounter: Payer: Self-pay | Admitting: Family Medicine

## 2015-11-15 VITALS — BP 118/82 | HR 86 | Temp 98.1°F | Ht 72.0 in | Wt 221.8 lb

## 2015-11-15 DIAGNOSIS — R790 Abnormal level of blood mineral: Secondary | ICD-10-CM

## 2015-11-15 DIAGNOSIS — K130 Diseases of lips: Secondary | ICD-10-CM

## 2015-11-15 DIAGNOSIS — R740 Nonspecific elevation of levels of transaminase and lactic acid dehydrogenase [LDH]: Secondary | ICD-10-CM

## 2015-11-15 DIAGNOSIS — K140 Glossitis: Secondary | ICD-10-CM

## 2015-11-15 DIAGNOSIS — R7401 Elevation of levels of liver transaminase levels: Secondary | ICD-10-CM

## 2015-11-15 DIAGNOSIS — IMO0002 Reserved for concepts with insufficient information to code with codable children: Secondary | ICD-10-CM

## 2015-11-15 DIAGNOSIS — E1129 Type 2 diabetes mellitus with other diabetic kidney complication: Secondary | ICD-10-CM

## 2015-11-15 DIAGNOSIS — E1165 Type 2 diabetes mellitus with hyperglycemia: Secondary | ICD-10-CM

## 2015-11-15 MED ORDER — FLUCONAZOLE 150 MG PO TABS: 150.0000 mg | ORAL_TABLET | Freq: Once | ORAL | 0 refills | Status: AC

## 2015-11-15 NOTE — Progress Notes (Signed)
HPI:  David Grant is a pleasant 45 year old here for an acute visit for pain in the corners of his mouth and his tongue. This is been going on for several days. Denies ulcers, recent illness, fevers. He did eat a persimmon which he thinks is what irritated his skin in these areas. He has a number of other questions. He saw the endocrinologist for his diabetes. I do not see any comment from the endocrinologist regarding the metformin and glipizide in light of his current liver issues. But, Mr. David Grant feels that he is to restart these medications. He saw the gastroenterologist and had a liver biopsy very recently for his persistent mild transaminitis in the setting of iron overload. He has not received the results of biopsy yet. The biopsy showed fatty liver and mild fibrosis/scarring. He reports he discussed phlebotomy with the gastroenterologist, but he isn't sure whether should be done or not done and he is concerned about the persistently elevated iron in the damage of May, last his body. He would like to see a hematologist.   ROS: See pertinent positives and negatives per HPI.  Past Medical History:  Diagnosis Date  . Abnormal blood level of iron 10/28/2015  . Diabetes mellitus without complication (HCC)   . Hyperlipidemia   . OSA on CPAP   . Transaminitis 06/28/2015   -abnormal US -hx alcohol us -elevated ferritin, but transferrin sat < 45, hemochromatosis genetic testing negative -referred to GI, advised to quit alcohol, advised healthy lifestyle and control of chronic disease - pt poorly compliant     Past Surgical History:  Procedure Laterality Date  . NO PAST SURGERIES      Family History  Problem Relation Age of Onset  . Bone cancer Father   . Diabetes Father   . Hypertension Father   . Colon cancer Neg Hx   . Colon polyps Neg Hx   . Kidney disease Neg Hx   . Esophageal cancer Neg Hx   . Heart disease Neg Hx     Social History   Social History  . Marital status: Married   Spouse name: N/A  . Number of children: 2  . Years of education: N/A   Occupational History  . Professor     [employer]   Social History Main Topics  . Smoking status: Light Tobacco Smoker    Packs/day: 0.25    Years: 20.00    Types: Cigarettes  . Smokeless tobacco: Former NeurosurgeonUser    Types: Chew     Comment:  1-2 cig per week  . Alcohol use 0.0 oz/week     Comment: rarely  . Drug use: No  . Sexual activity: Not Asked   Other Topics Concern  . None   Social History Narrative   Work or School: Publishing copyresearch statistic instructor      Home Situation: lives with wife and child      Spiritual Beliefs: Christian      Lifestyle: no exercise, diet ok              Current Outpatient Prescriptions:  .  ACCU-CHEK FASTCLIX LANCETS MISC, Test twice daily., Disp: 102 each, Rfl: 0 .  aspirin 81 MG tablet, Take 81 mg by mouth daily., Disp: , Rfl:  .  cholecalciferol (VITAMIN D) 400 units TABS tablet, Take 400 Units by mouth., Disp: , Rfl:  .  Coenzyme Q10 (CO Q 10) 100 MG CAPS, Take 100 mg by mouth., Disp: , Rfl:  .  glucose  blood (ACCU-CHEK SMARTVIEW) test strip, Use as directed to check blood sugar., Disp: 100 each, Rfl: 3 .  JANUVIA 100 MG tablet, TAKE 1 TABLET BY MOUTH DAILY, Disp: 90 tablet, Rfl: 1 .  lisinopril (PRINIVIL,ZESTRIL) 2.5 MG tablet, Take 1 tablet (2.5 mg total) by mouth daily., Disp: 90 tablet, Rfl: 3 .  metFORMIN (GLUMETZA) 1000 MG (MOD) 24 hr tablet, Take 1,000 mg by mouth 2 (two) times daily., Disp: , Rfl:  .  Omega-3 Fatty Acids (FISH OIL) 1200 MG CPDR, Take 1,200 mg by mouth., Disp: , Rfl:  .  pioglitazone (ACTOS) 45 MG tablet, Take 1 tablet (45 mg total) by mouth daily., Disp: 90 tablet, Rfl: 3 .  pravastatin (PRAVACHOL) 40 MG tablet, TAKE 1 TABLET (40 MG TOTAL) BY MOUTH DAILY., Disp: 90 tablet, Rfl: 1 .  fluconazole (DIFLUCAN) 150 MG tablet, Take 1 tablet (150 mg total) by mouth once., Disp: 1 tablet, Rfl: 0  EXAM:  Vitals:   11/15/15 1257  BP: 118/82   Pulse: 86  Temp: 98.1 F (36.7 C)    Body mass index is 30.08 kg/m.  GENERAL: vitals reviewed and listed above, alert, oriented, appears well hydrated and in no acute distress  HEENT: atraumatic, conjunttiva clear, no obvious abnormalities on inspection of external nose and ears, dry lips, mild cracking of the lips and the corners, normal appearance of the mouth and tongue.  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 Christella Hartigan (GI), to get his thoughts on continuing metformin and glipizide in the setting of his liver disease. Advised should adhere to strict cessation from alcohol, healthy diet and regular aerobic exercise, no smoking -referral to hematology for further discussion/managment elevated iron -Patient advised to return or notify a doctor immediately if symptoms worsen or persist or new concerns arise.  Patient Instructions  Keep follow up as planned.  For the lips and mouth discomfort: -take the diflucan once -use hydrocortisone cream (over the counter) 2 time daily to lips -use Aquaphor liberally to protect the  lips  We placed a referral for you as discussed to the hematologist. It usually takes about 1-2 weeks to process and schedule this referral. If you have not heard from [place] regarding this appointment in 2 weeks please contact our office.  We recommend the following healthy lifestyle for LIFE: 1) Small portions.    2) Eat a healthy clean diet.  * Tip: Avoid (less then 1 serving per week): processed foods, sweets, sweetened drinks, white starches (rice, flour, bread, potatoes, pasta, etc), red meat, fast foods, butter  *Tip: CHOOSE instead   * 5-9 servings per day of fresh or frozen fruits and vegetables (but not corn, potatoes, bananas, canned or dried fruit)   *  nuts and seeds, beans   *olives and olive oil   *small portions of lean meats such as fish and white chicken    *small portions of whole grains  3)Get at least 150 minutes of sweaty aerobic exercise per week.  4)Reduce stress - consider counseling, meditation and relaxation to balance other aspects of your life.    Kriste Basque R., DO

## 2015-11-15 NOTE — Patient Instructions (Addendum)
Keep follow up as planned.  For the lips and mouth discomfort: -take the diflucan once -use hydrocortisone cream (over the counter) 2 time daily to lips -use Aquaphor liberally to protect the lips  We placed a referral for you as discussed to the hematologist. It usually takes about 1-2 weeks to process and schedule this referral. If you have not heard from us regarding this appointment in 2 weeks please contact our office.  We recommend the following healthy lifestyle for LIFE: 1) Small portions.    2) Eat a healthy clean diet.  * Tip: Avoid (less then 1 serving per week): processed foods, sweets, sweetened drinks, white starches (rice, flour, bread, potatoes, pasta, etc), red meat, fast foods, butter  *Tip: CHOOSE instead   * 5-9 servings per day of fresh or frozen fruits and vegetables (but not corn, potatoes, bananas, canned or dried fruit)   *nuts and seeds, beans   *olives and olive oil   *small portions of lean meats such as fish and white chicken    *small portions of whole grains  3)Get at least 150 minutes of sweaty aerobic exercise per week.  4)Reduce stress - consider counseling, meditation and relaxation to balance other aspects of your life.

## 2015-11-15 NOTE — Progress Notes (Signed)
Pre visit review using our clinic review tool, if applicable. No additional management support is needed unless otherwise documented below in the visit note. 

## 2015-11-15 NOTE — Telephone Encounter (Signed)
Discussed at appt.

## 2015-11-16 ENCOUNTER — Other Ambulatory Visit: Payer: Self-pay | Admitting: Family Medicine

## 2015-11-16 ENCOUNTER — Telehealth: Payer: Self-pay

## 2015-11-16 MED ORDER — METFORMIN HCL ER (MOD) 1000 MG PO TB24: 1000.0000 mg | ORAL_TABLET | Freq: Two times a day (BID) | ORAL | 3 refills | Status: DC

## 2015-11-16 MED ORDER — GLIPIZIDE 10 MG PO TABS: 10.0000 mg | ORAL_TABLET | Freq: Two times a day (BID) | ORAL | 3 refills | Status: DC

## 2015-11-16 NOTE — Telephone Encounter (Signed)
Received PA request from [facility] for Metformin HCL ER (MOD). PA submitted & is pending. Key: [id number]

## 2015-11-17 ENCOUNTER — Encounter: Payer: Self-pay | Admitting: Family Medicine

## 2015-11-18 NOTE — Telephone Encounter (Signed)
This has been sent to Dr. Selena BattenKim. She will return tomorrow and can advise further instructions for change in medication. Thank you

## 2015-11-18 NOTE — Telephone Encounter (Signed)
[facility] pharm states the rx metFORMIN (GLUMETZA) 1000 MG (MOD) 24 hr tablet is extended release  Sent to them. Not sure if this is a change in therapy..  They sent PA over by mistake.  Pt has been on metFORMIN (GLUCOPHAGE) 1000 MG tablet BID  Would like to switch pt back to the  metFORMIN (GLUCOPHAGE) 1000 MG tablet  Due to the first med being over $800  Pt is out of med and needs refill today. pLease call [facility]

## 2015-11-19 MED ORDER — METFORMIN HCL 1000 MG PO TABS: 1000.0000 mg | ORAL_TABLET | Freq: Two times a day (BID) | ORAL | 1 refills | Status: DC

## 2015-11-19 NOTE — Telephone Encounter (Signed)
Pt states his lips are not better. After the first day if got worse and he cannot talk well now.  Pt has been waitning on an answer.  [facility]  [phone]

## 2015-11-19 NOTE — Telephone Encounter (Signed)
I sent per request. Please check with pharmacy to ensure they got the order for the cheaper option. Thanks. Thanks.

## 2015-11-22 ENCOUNTER — Other Ambulatory Visit: Payer: Self-pay | Admitting: *Deleted

## 2015-11-22 MED ORDER — FLUCONAZOLE 100 MG PO TABS: 100.0000 mg | ORAL_TABLET | Freq: Every day | ORAL | 0 refills | Status: DC

## 2015-11-22 NOTE — Telephone Encounter (Signed)
Can you check to make sure pharmacy has what he requested? I sent this last week. Thanks.

## 2015-11-22 NOTE — Telephone Encounter (Signed)
Rx done and the pt was notified via Mychart message. 

## 2015-11-22 NOTE — Telephone Encounter (Signed)
I called [facility] and the pharmacist stated the Rx is ready for the pt to pick up and I left a detailed message with this information at the pts cell number.

## 2015-11-22 NOTE — Telephone Encounter (Signed)
This is what I just asked you about. Okay to forward.

## 2015-11-30 ENCOUNTER — Other Ambulatory Visit: Payer: Self-pay | Admitting: Family Medicine

## 2015-12-05 ENCOUNTER — Encounter: Payer: Self-pay | Admitting: Hematology

## 2015-12-05 ENCOUNTER — Telehealth: Payer: Self-pay | Admitting: Hematology

## 2015-12-05 NOTE — Telephone Encounter (Signed)
Pt confirmed appt, address, insurance. Mailed pt letter, in basket referring provider appt date/time

## 2015-12-13 IMAGING — US US ABDOMEN LIMITED
1 series · 14 of 25 positions shown · non-contrast
Comparison: None.

CLINICAL DATA: Elevated liver function tests.

EXAM:
US ABDOMEN LIMITED - RIGHT UPPER QUADRANT

[Series 1: us abdomen limited · 0.22mm/px · 14 of 46 slices shown]
[im 1/46]
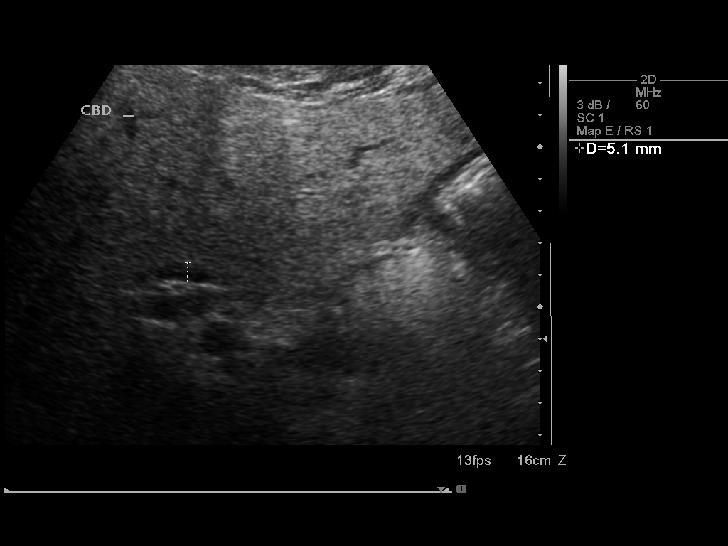
[im 4/46]
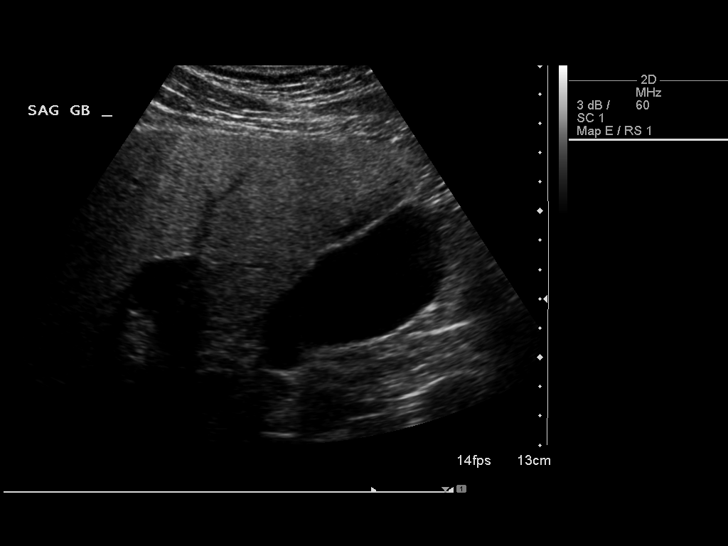
[im 8/46]
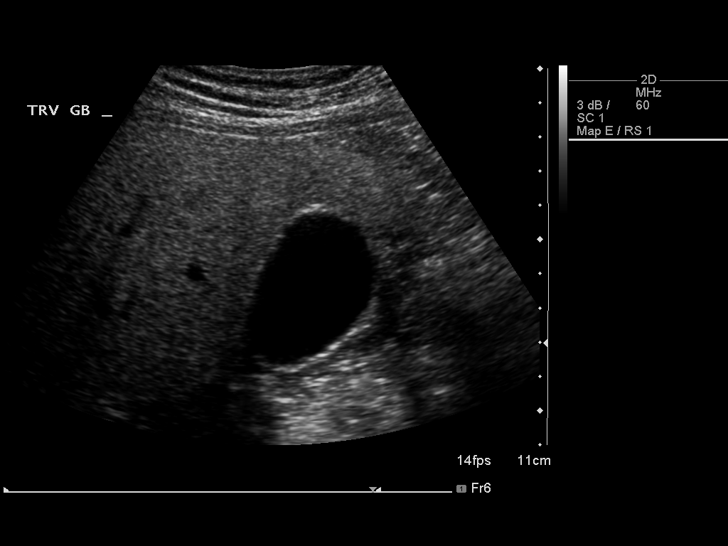
[im 12/46]
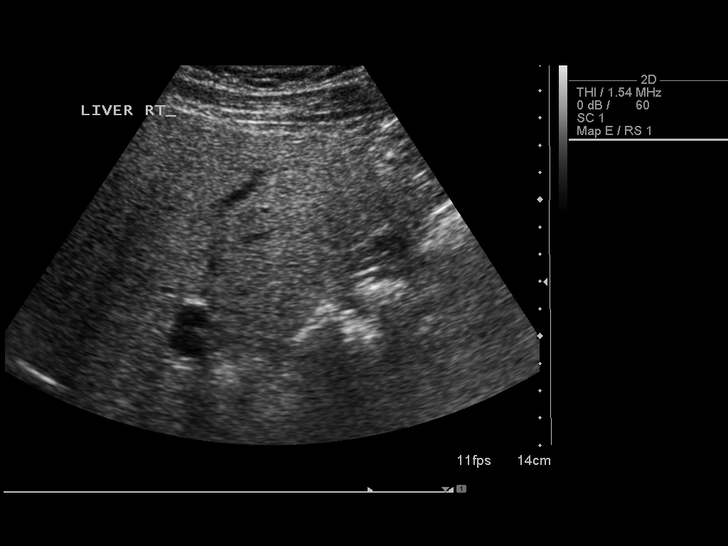
[im 16/46]
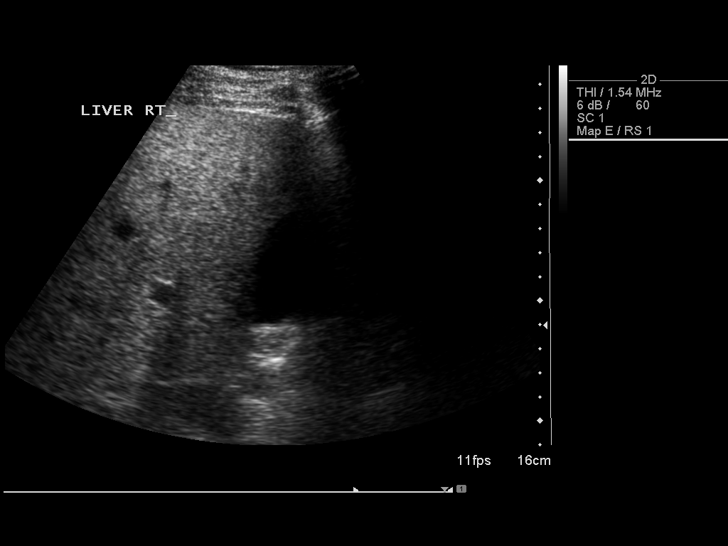
[im 17/46]
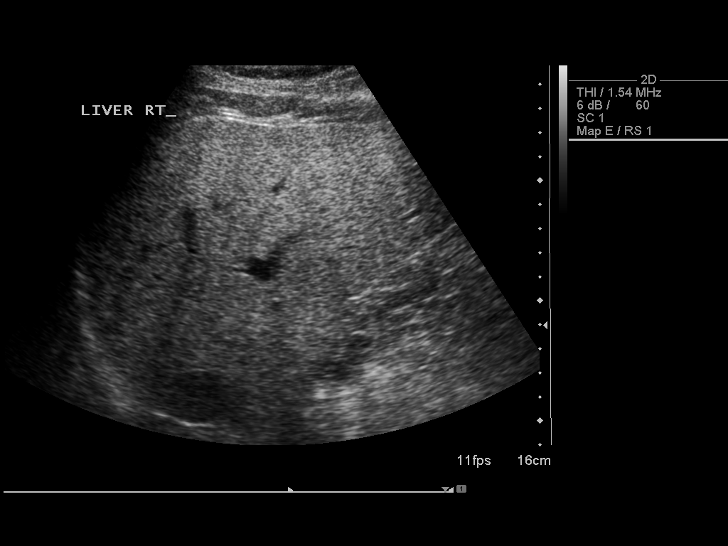
[im 21/46]
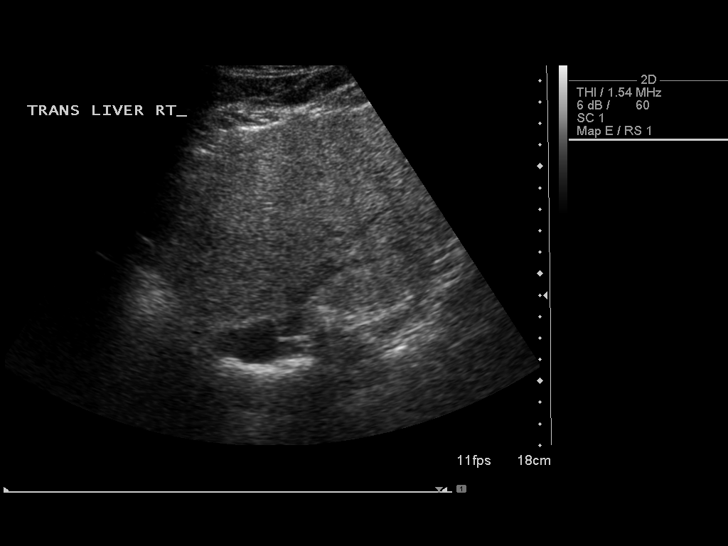
[im 25/46]
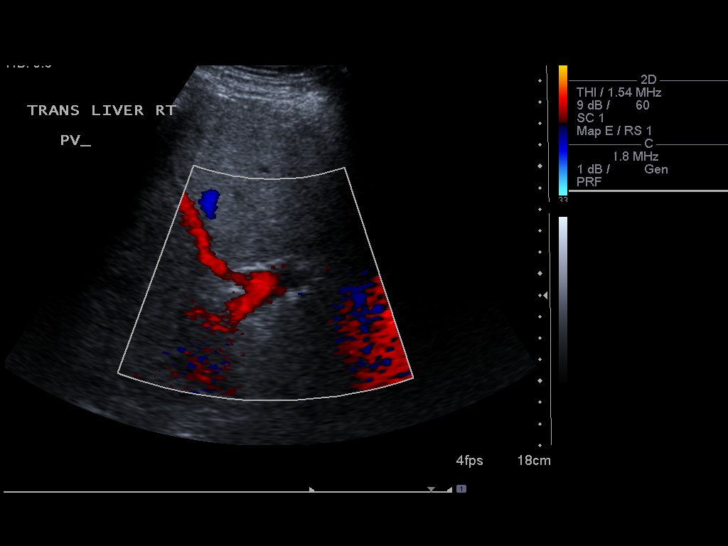
[im 29/46]
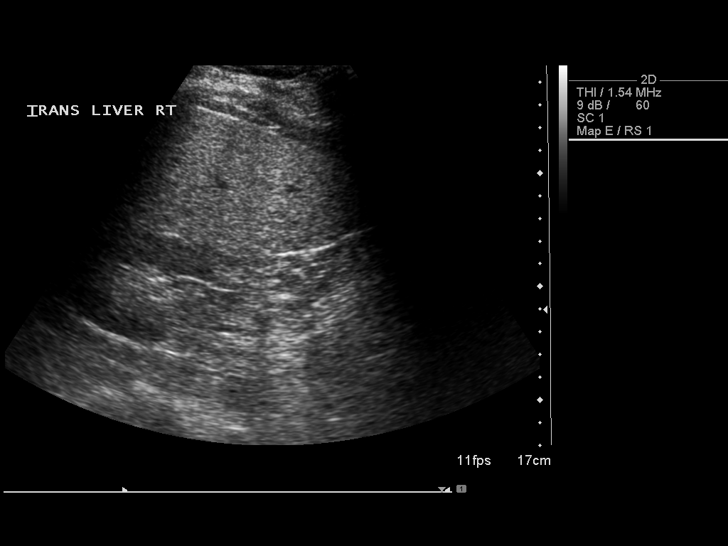
[im 31/46]
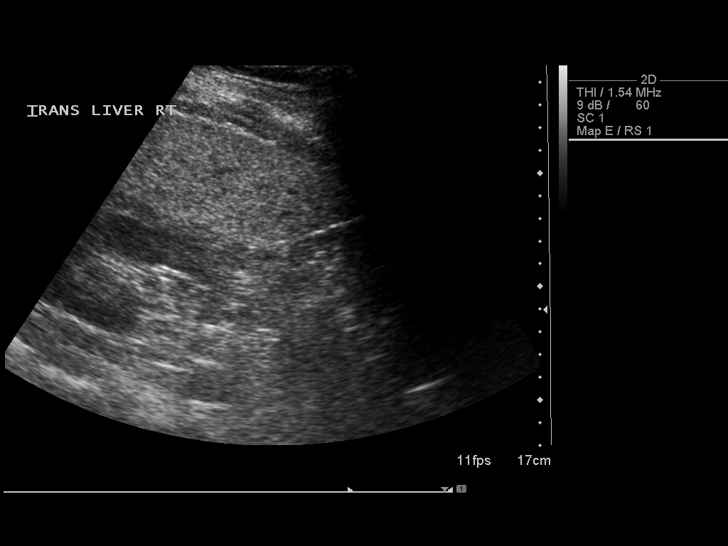
[im 34/46]
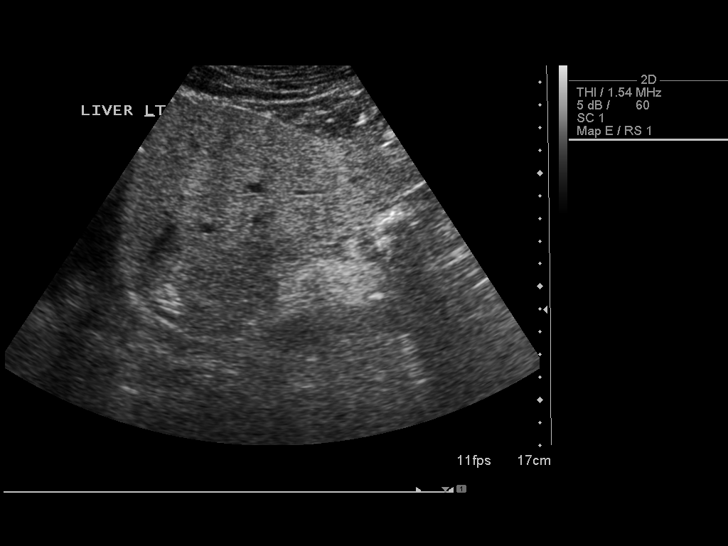
[im 38/46]
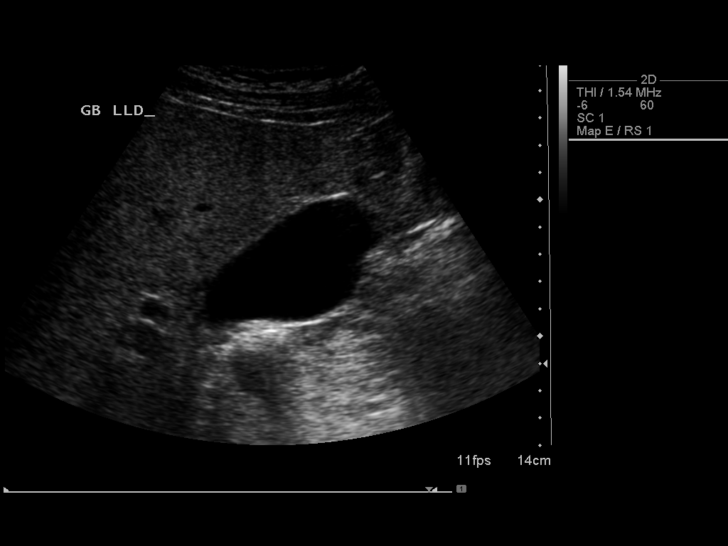
[im 42/46]
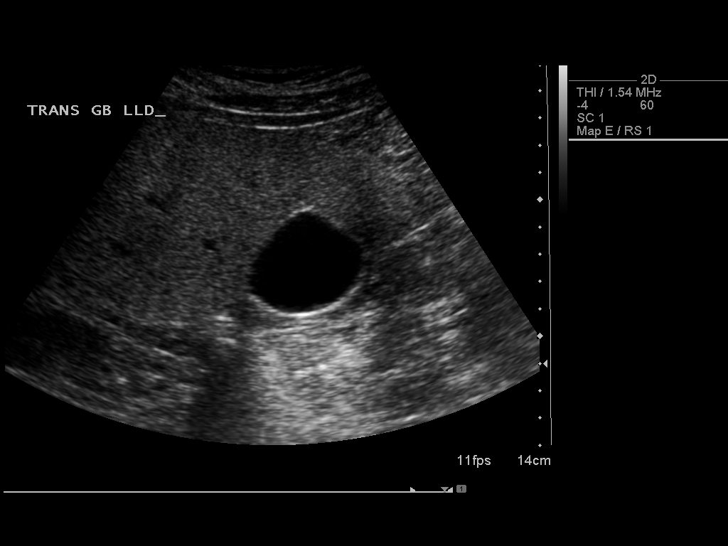
[im 46/46]
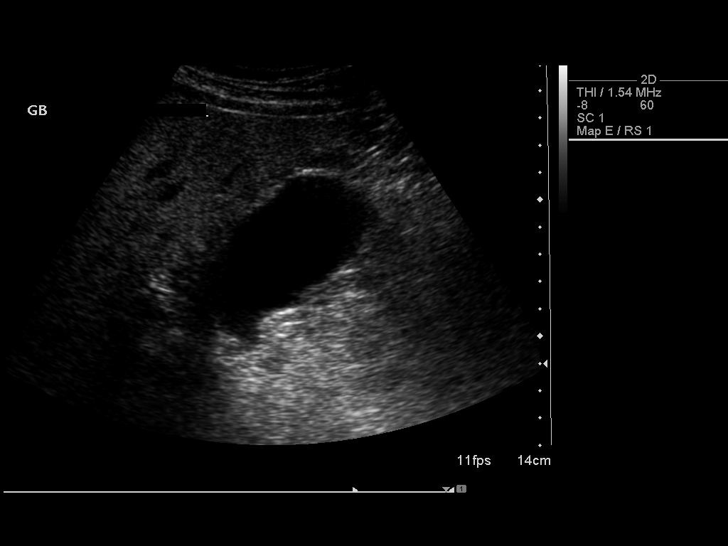

[14 of 25 positions shown; findings below may reference images not displayed]

FINDINGS: Gallbladder:

No gallstones or wall thickening visualized. No sonographic Murphy
sign noted.

Common bile duct:

Diameter: 5.1 mm

Liver:

Hepatic echotexture is coarse and echogenic. This is consistent with
fatty infiltration and/or hepatocellular disease.
IMPRESSION: Coarse echogenic hepatic echotexture consistent with fatty
infiltration and/or hepatocellular disease.

## 2015-12-13 NOTE — Telephone Encounter (Signed)
I left a message for the pt to return my call. 

## 2015-12-13 NOTE — Telephone Encounter (Signed)
David CollumJo Anne, please see prior response to his message. Please call him and review the recs you provided him from that response since he is stating he did not hear back from us. Please let him know that you did respond right away after his last email (see response 11/17/2015)l. Ok to send one refill on this as well - but really I do think that the measures from the last email will be more helpful and he needs to follow up if not. Thanks.

## 2015-12-14 MED ORDER — FLUCONAZOLE 100 MG PO TABS: 100.0000 mg | ORAL_TABLET | Freq: Every day | ORAL | 0 refills | Status: DC

## 2015-12-14 NOTE — Telephone Encounter (Signed)
12/14/2015-I called the pt and informed him of the message below and he is aware the Rx for Diflucan was sent to his pharmacy today and advised he needs an office visit if he is not better after taking this.

## 2016-01-20 ENCOUNTER — Encounter: Payer: BC Managed Care – PPO | Admitting: Hematology

## 2016-01-25 ENCOUNTER — Telehealth: Payer: Self-pay | Admitting: Family Medicine

## 2016-01-25 MED ORDER — PRAVASTATIN SODIUM 40 MG PO TABS: ORAL_TABLET | ORAL | 1 refills | Status: DC

## 2016-01-25 NOTE — Telephone Encounter (Signed)
Rx done. 

## 2016-01-25 NOTE — Telephone Encounter (Signed)
Pt would like to change all med refills to new pharmacy  [facility].  Right now all pt needs is the pravastatin (PRAVACHOL) 40 MG tablet  But please send all if can. 90 day

## 2016-02-18 ENCOUNTER — Ambulatory Visit (INDEPENDENT_AMBULATORY_CARE_PROVIDER_SITE_OTHER): Payer: BC Managed Care – PPO | Admitting: Family Medicine

## 2016-02-18 ENCOUNTER — Encounter: Payer: Self-pay | Admitting: Family Medicine

## 2016-02-18 VITALS — BP 100/78 | HR 94 | Temp 98.3°F | Ht 72.0 in | Wt 225.7 lb

## 2016-02-18 DIAGNOSIS — R790 Abnormal level of blood mineral: Secondary | ICD-10-CM | POA: Diagnosis not present

## 2016-02-18 DIAGNOSIS — E1129 Type 2 diabetes mellitus with other diabetic kidney complication: Secondary | ICD-10-CM

## 2016-02-18 DIAGNOSIS — E789 Disorder of lipoprotein metabolism, unspecified: Secondary | ICD-10-CM

## 2016-02-18 DIAGNOSIS — G4733 Obstructive sleep apnea (adult) (pediatric): Secondary | ICD-10-CM

## 2016-02-18 DIAGNOSIS — R740 Nonspecific elevation of levels of transaminase and lactic acid dehydrogenase [LDH]: Secondary | ICD-10-CM | POA: Diagnosis not present

## 2016-02-18 DIAGNOSIS — R7401 Elevation of levels of liver transaminase levels: Secondary | ICD-10-CM

## 2016-02-18 DIAGNOSIS — M62838 Other muscle spasm: Secondary | ICD-10-CM

## 2016-02-18 DIAGNOSIS — IMO0002 Reserved for concepts with insufficient information to code with codable children: Secondary | ICD-10-CM

## 2016-02-18 DIAGNOSIS — E1165 Type 2 diabetes mellitus with hyperglycemia: Secondary | ICD-10-CM | POA: Diagnosis not present

## 2016-02-18 LAB — CBC WITH DIFFERENTIAL/PLATELET
BASOS ABS: 0 {cells}/uL (ref 0–200)
Basophils Relative: 0 %
EOS PCT: 2 %
Eosinophils Absolute: 148 cells/uL (ref 15–500)
HCT: 49.5 % (ref 38.5–50.0)
HEMOGLOBIN: 16.7 g/dL (ref 13.2–17.1)
LYMPHS ABS: 3182 {cells}/uL (ref 850–3900)
LYMPHS PCT: 43 %
MCH: 30 pg (ref 27.0–33.0)
MCHC: 33.7 g/dL (ref 32.0–36.0)
MCV: 88.9 fL (ref 80.0–100.0)
MPV: 10.3 fL (ref 7.5–12.5)
Monocytes Absolute: 666 cells/uL (ref 200–950)
Monocytes Relative: 9 %
NEUTROS PCT: 46 %
Neutro Abs: 3404 cells/uL (ref 1500–7800)
Platelets: 169 10*3/uL (ref 140–400)
RBC: 5.57 MIL/uL (ref 4.20–5.80)
RDW: 14.2 % (ref 11.0–15.0)
WBC: 7.4 10*3/uL (ref 3.8–10.8)

## 2016-02-18 LAB — FERRITIN: Ferritin: 745 ng/mL — ABNORMAL HIGH (ref 20–380)

## 2016-02-18 LAB — COMPLETE METABOLIC PANEL WITH GFR
ALBUMIN: 4.6 g/dL (ref 3.6–5.1)
ALK PHOS: 55 U/L (ref 40–115)
ALT: 89 U/L — AB (ref 9–46)
AST: 50 U/L — AB (ref 10–40)
BILIRUBIN TOTAL: 0.6 mg/dL (ref 0.2–1.2)
BUN: 21 mg/dL (ref 7–25)
CALCIUM: 10.1 mg/dL (ref 8.6–10.3)
CO2: 29 mmol/L (ref 20–31)
CREATININE: 1.12 mg/dL (ref 0.60–1.35)
Chloride: 102 mmol/L (ref 98–110)
GFR, Est African American: >89 mL/min (ref >=60)
GFR, Est Non African American: 78 mL/min (ref >=60)
GLUCOSE: 101 mg/dL — AB (ref 65–99)
POTASSIUM: 4.6 mmol/L (ref 3.5–5.3)
Sodium: 138 mmol/L (ref 135–146)
TOTAL PROTEIN: 7.7 g/dL (ref 6.1–8.1)

## 2016-02-18 LAB — HEMOGLOBIN A1C
Hgb A1c MFr Bld: 6.4 % — ABNORMAL HIGH (ref <5.7)
Mean Plasma Glucose: 137 mg/dL

## 2016-02-18 MED ORDER — LISINOPRIL 2.5 MG PO TABS: 2.5000 mg | ORAL_TABLET | Freq: Every day | ORAL | 1 refills | Status: DC

## 2016-02-18 MED ORDER — METFORMIN HCL 1000 MG PO TABS
1000.0000 mg | ORAL_TABLET | Freq: Two times a day (BID) | ORAL | 1 refills | Status: DC
Start: 2016-02-18 — End: 2016-10-03

## 2016-02-18 MED ORDER — SITAGLIPTIN PHOSPHATE 100 MG PO TABS: 100.0000 mg | ORAL_TABLET | Freq: Every day | ORAL | 1 refills | Status: DC

## 2016-02-18 MED ORDER — CYCLOBENZAPRINE HCL 5 MG PO TABS: 5.0000 mg | ORAL_TABLET | Freq: Three times a day (TID) | ORAL | 1 refills | Status: DC | PRN

## 2016-02-18 MED ORDER — PRAVASTATIN SODIUM 40 MG PO TABS: ORAL_TABLET | ORAL | 1 refills | Status: DC

## 2016-02-18 MED ORDER — CYCLOBENZAPRINE HCL 5 MG PO TABS: 5.0000 mg | ORAL_TABLET | Freq: Every day | ORAL | 0 refills | Status: DC

## 2016-02-18 MED ORDER — GLIPIZIDE 10 MG PO TABS: 10.0000 mg | ORAL_TABLET | Freq: Two times a day (BID) | ORAL | 1 refills | Status: DC

## 2016-02-18 NOTE — Progress Notes (Signed)
Pre visit review using our clinic review tool, if applicable. No additional management support is needed unless otherwise documented below in the visit note. 

## 2016-02-18 NOTE — Progress Notes (Signed)
HPI:  David Grant is 46 year old [place] male with a PMH significant for DM, HLD, OSA, Transaminates, elevated blood iron and poor compliance here for follow up. He saw the endocrinologist whom added pioglitazone. He reports this caused terrible hypoglycemia and he stopped it. He is very upset about the financial stress and outcome of the GI follow up for his iron overload. He reports they gave him no follow up and he is frustrated as wanted to prevent further issues with the elevated iron but they had no instructions for him. He has a very large bill from the biopsy and the [place] and is frustrated with this. He did not see the hematologist as he reports he can not do it right now financially. Reports otherwise doing ok. His diet and exercise are not great. He has had a lot of stress and carries this in L trapezius muscle region. Gets soreness and tension here.   ROS: See pertinent positives and negatives per HPI.  Past Medical History:  Diagnosis Date  . Abnormal blood level of iron 10/28/2015  . Diabetes mellitus without complication ([place])   . Hyperlipidemia   . OSA on CPAP   . Transaminitis 06/28/2015   -abnormal [place] -hx alcohol [place] -elevated ferritin, but transferrin sat < 45, hemochromatosis genetic testing negative -referred to GI, advised to quit alcohol, advised healthy lifestyle and control of chronic disease - pt poorly compliant     Past Surgical History:  Procedure Laterality Date  . NO PAST SURGERIES      Family History  Problem Relation Age of Onset  . Bone cancer Father   . Diabetes Father   . Hypertension Father   . Colon cancer Neg Hx   . Colon polyps Neg Hx   . Kidney disease Neg Hx   . Esophageal cancer Neg Hx   . Heart disease Neg Hx     Social History   Social History  . Marital status: Married    Spouse name: N/A  . Number of children: 2  . Years of education: N/A   Occupational History  . Professor     [employer]   Social History Main Topics  .  Smoking status: Light Tobacco Smoker    Packs/day: 0.25    Years: 20.00    Types: Cigarettes  . Smokeless tobacco: Former Systems developer    Types: Chew     Comment:  1-2 cig per week  . Alcohol use 0.0 oz/week     Comment: rarely  . Drug use: No  . Sexual activity: Not Asked   Other Topics Concern  . 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 child      Spiritual Beliefs: Christian      Lifestyle: no exercise, diet ok              Current Outpatient Prescriptions:  .  ACCU-CHEK FASTCLIX LANCETS MISC, Test twice daily., Disp: 102 each, Rfl: 0 .  aspirin 81 MG tablet, Take 81 mg by mouth daily., Disp: , Rfl:  .  cholecalciferol (VITAMIN D) 400 units TABS tablet, Take 400 Units by mouth., Disp: , Rfl:  .  Coenzyme Q10 (CO Q 10) 100 MG CAPS, Take 100 mg by mouth., Disp: , Rfl:  .  fluconazole (DIFLUCAN) 100 MG tablet, Take 1 tablet (100 mg total) by mouth daily., Disp: 5 tablet, Rfl: 0 .  glipiZIDE (GLUCOTROL)  10 MG tablet, Take 1 tablet (10 mg total) by mouth 2 (two) times daily before a meal., Disp: 180 tablet, Rfl: 1 .  glucose blood (ACCU-CHEK SMARTVIEW) test strip, Use as directed to check blood sugar., Disp: 100 each, Rfl: 3 .  lisinopril (PRINIVIL,ZESTRIL) 2.5 MG tablet, Take 1 tablet (2.5 mg total) by mouth daily., Disp: 90 tablet, Rfl: 1 .  metFORMIN (GLUCOPHAGE) 1000 MG tablet, Take 1 tablet (1,000 mg total) by mouth 2 (two) times daily with a meal., Disp: 180 tablet, Rfl: 1 .  Omega-3 Fatty Acids (FISH OIL) 1200 MG CPDR, Take 1,200 mg by mouth., Disp: , Rfl:  .  pravastatin (PRAVACHOL) 40 MG tablet, TAKE 1 TABLET (40 MG TOTAL) BY MOUTH DAILY., Disp: 90 tablet, Rfl: 1 .  sitaGLIPtin (JANUVIA) 100 MG tablet, Take 1 tablet (100 mg total) by mouth daily., Disp: 90 tablet, Rfl: 1 .  cyclobenzaprine (FLEXERIL) 5 MG tablet, Take 1 tablet (5 mg total) by mouth at bedtime., Disp: 30 tablet, Rfl:  0  EXAM:  Vitals:   02/18/16 1602  BP: 100/78  Pulse: 94  Temp: 98.3 F (36.8 C)    Body mass index is 30.61 kg/m.  GENERAL: vitals reviewed and listed above, alert, oriented, appears well hydrated and in no acute distress  HEENT: atraumatic, conjunttiva clear, no obvious abnormalities on inspection of external nose and ears  NECK: no obvious masses on inspection  LUNGS: clear to auscultation bilaterally, no wheezes, rales or rhonchi, good air movement  CV: HRRR, no peripheral edema  MS: moves all extremities without noticeable abnormality, tension and mild TTP in bilat trap muscles/mid fibers  PSYCH: pleasant and cooperative, no obvious depression or anxiety  ASSESSMENT AND PLAN:  Discussed the following assessment and plan:  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, without long-term current use of insulin (HCC) - Plan: Hemoglobin A1c, CANCELED: Hemoglobin A1c  Lipid disorder  Transaminitis - Plan: CMP with eGFR  Abnormal blood level of iron - Plan: CBC with Differential/Platelet, Ferritin, CANCELED: CBC with Differential/Platelets, CANCELED: Ferritin  Obstructive sleep apnea  Muscle spasm  -will check labs -for his diabetes would use very low dose of actos if it is needed -healthy lifestyle advised -advised calling the [place] financial assistance office regarding to help with his billing questions -he declined GI follow up or hematology eval for now until gets bills sorted out -flexeril at night, heat and stretching for the muscle tension -Patient advised to return or notify a doctor immediately if symptoms worsen or persist or new concerns arise.  Patient Instructions  BEFORE YOU LEAVE: -follow up: 3 months -labs  Hang in there.  Please let us know when you wish to see the blood specialist.  Stop the Actos. If we need to restart it we will use a very low dose.  We have ordered labs or studies at this visit. It can  take up to 1-2 weeks for results and processing. IF results require follow up or explanation, we will call you with instructions. Clinically stable results will be released to your [facility]. If you have not heard from [place] or cannot find your results in [facility] in 2 weeks please contact our office at [phone].  If you are not yet signed up for [facility], please consider signing up.   We recommend the following healthy lifestyle for LIFE: 1) Small portions.   Tip: eat off of a salad plate instead of a dinner plate.  Tip: if you need more or  a snack choose fruits, veggies and/or a handful of nuts or seeds.  2) Eat a healthy clean diet.  * Tip: Avoid (less then 1 serving per week): processed foods, sweets, sweetened drinks, white starches (rice, flour, bread, potatoes, pasta, etc), red meat, fast foods, butter  *Tip: CHOOSE instead   * 5-9 servings per day of fresh or frozen fruits and vegetables (but not corn, potatoes, bananas, canned or dried fruit)   *nuts and seeds, beans   *olives and olive oil   *small portions of lean meats such as fish and white chicken    *small portions of whole grains  3)Get at least 150 minutes of sweaty aerobic exercise per week.  4)Reduce stress - consider counseling, meditation and relaxation to balance other aspects of your life.          Colin Benton R., DO

## 2016-02-18 NOTE — Patient Instructions (Signed)
BEFORE YOU LEAVE: -follow up: 3 months -labs  Hang in there.  Please let us know when you wish to see the blood specialist.  Stop the Actos. If we need to restart it we will use a very low dose.  We have ordered labs or studies at this visit. It can take up to 1-2 weeks for results and processing. IF results require follow up or explanation, we will call you with instructions. Clinically stable results will be released to your [facility]. If you have not heard from us or cannot find your results in [facility] in 2 weeks please contact our office at [phone].  If you are not yet signed [facility], please consider signing up.   We recommend the following healthy lifestyle for LIFE: 1) Small portions.   Tip: eat off of a salad plate instead of a dinner plate.  Tip: if you need more or a snack choose fruits, veggies and/or a handful of nuts or seeds.  2) Eat a healthy clean diet.  * Tip: Avoid (less then 1 serving per week): processed foods, sweets, sweetened drinks, white starches (rice, flour, bread, potatoes, pasta, etc), red meat, fast foods, butter  *Tip: CHOOSE instead   * 5-9 servings per day of fresh or frozen fruits and vegetables (but not corn, potatoes, bananas, canned or dried fruit)   *nuts and seeds, beans   *olives and olive oil   *small portions of lean meats such as fish and white chicken    *small portions of whole grains  3)Get at least 150 minutes of sweaty aerobic exercise per week.  4)Reduce stress - consider counseling, meditation and relaxation to balance other aspects of your life.

## 2016-06-02 ENCOUNTER — Encounter: Payer: Self-pay | Admitting: Family Medicine

## 2016-06-02 ENCOUNTER — Ambulatory Visit (INDEPENDENT_AMBULATORY_CARE_PROVIDER_SITE_OTHER): Payer: BC Managed Care – PPO | Admitting: Family Medicine

## 2016-06-02 VITALS — BP 120/80 | HR 84 | Temp 98.3°F | Ht 72.0 in | Wt 229.8 lb

## 2016-06-02 DIAGNOSIS — E789 Disorder of lipoprotein metabolism, unspecified: Secondary | ICD-10-CM

## 2016-06-02 DIAGNOSIS — M25512 Pain in left shoulder: Secondary | ICD-10-CM | POA: Diagnosis not present

## 2016-06-02 DIAGNOSIS — IMO0002 Reserved for concepts with insufficient information to code with codable children: Secondary | ICD-10-CM

## 2016-06-02 DIAGNOSIS — N189 Chronic kidney disease, unspecified: Secondary | ICD-10-CM

## 2016-06-02 DIAGNOSIS — E1129 Type 2 diabetes mellitus with other diabetic kidney complication: Secondary | ICD-10-CM | POA: Diagnosis not present

## 2016-06-02 DIAGNOSIS — E1165 Type 2 diabetes mellitus with hyperglycemia: Secondary | ICD-10-CM

## 2016-06-02 DIAGNOSIS — R790 Abnormal level of blood mineral: Secondary | ICD-10-CM

## 2016-06-02 DIAGNOSIS — G4733 Obstructive sleep apnea (adult) (pediatric): Secondary | ICD-10-CM | POA: Diagnosis not present

## 2016-06-02 LAB — CBC WITH DIFFERENTIAL/PLATELET
BASOS PCT: 1 %
Basophils Absolute: 62 cells/uL (ref 0–200)
Eosinophils Absolute: 186 cells/uL (ref 15–500)
Eosinophils Relative: 3 %
HEMATOCRIT: 50.2 % — AB (ref 38.5–50.0)
Hemoglobin: 16.8 g/dL (ref 13.2–17.1)
LYMPHS ABS: 2728 {cells}/uL (ref 850–3900)
Lymphocytes Relative: 44 %
MCH: 29.3 pg (ref 27.0–33.0)
MCHC: 33.5 g/dL (ref 32.0–36.0)
MCV: 87.5 fL (ref 80.0–100.0)
MONO ABS: 558 {cells}/uL (ref 200–950)
MPV: 10.5 fL (ref 7.5–12.5)
Monocytes Relative: 9 %
Neutro Abs: 2666 cells/uL (ref 1500–7800)
Neutrophils Relative %: 43 %
Platelets: 136 10*3/uL — ABNORMAL LOW (ref 140–400)
RBC: 5.74 MIL/uL (ref 4.20–5.80)
RDW: 13.9 % (ref 11.0–15.0)
WBC: 6.2 10*3/uL (ref 3.8–10.8)

## 2016-06-02 LAB — BASIC METABOLIC PANEL
BUN: 13 mg/dL (ref 7–25)
CO2: 25 mmol/L (ref 20–31)
Calcium: 9.6 mg/dL (ref 8.6–10.3)
Chloride: 102 mmol/L (ref 98–110)
Creat: 0.95 mg/dL (ref 0.60–1.35)
Glucose, Bld: 145 mg/dL — ABNORMAL HIGH (ref 65–99)
POTASSIUM: 4.3 mmol/L (ref 3.5–5.3)
SODIUM: 136 mmol/L (ref 135–146)

## 2016-06-02 NOTE — Progress Notes (Signed)
HPI:  Follow up DM, Hyperlipidemia, Obstructive Sleep apnea, elevated blood iron and hx poor compliance here for follow up. Doing well. Reports less stressed. Diet is not great and no regular exercise. Still declines hematology referral for the elevated iron. Occ pain in L trapezius. None now. Occurs with stress. Then resolves on its own.No weakness, numbness, fevers, malaise. Due for labs, eye exam.  ROS: See pertinent positives and negatives per HPI.  Past Medical History:  Diagnosis Date  . Abnormal blood level of iron 10/28/2015  . Diabetes mellitus without complication (HCC)   . Hyperlipidemia   . OSA on CPAP   . Transaminitis 06/28/2015   -abnormal [place] -hx alcohol [place] -elevated ferritin, but transferrin sat < 45, hemochromatosis genetic testing negative -referred to GI, advised to quit alcohol, advised healthy lifestyle and control of chronic disease - pt poorly compliant     Past Surgical History:  Procedure Laterality Date  . NO PAST SURGERIES      Family History  Problem Relation Age of Onset  . Bone cancer Father   . Diabetes Father   . Hypertension Father   . Colon cancer Neg Hx   . Colon polyps Neg Hx   . Kidney disease Neg Hx   . Esophageal cancer Neg Hx   . Heart disease Neg Hx     Social History   Social History  . Marital status: Married    Spouse name: N/A  . Number of children: 2  . Years of education: N/A   Occupational History  . Professor     [employer]   Social History Main Topics  . Smoking status: Light Tobacco Smoker    Packs/day: 0.25    Years: 20.00    Types: Cigarettes  . Smokeless tobacco: Former Neurosurgeon    Types: Chew     Comment:  1-2 cig per week  . Alcohol use 0.0 oz/week     Comment: rarely  . Drug use: No  . Sexual activity: Not Asked   Other Topics Concern  . 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  ACCU-CHEK FASTCLIX LANCETS MISC, Test twice daily., Disp: 102 each, Rfl: 0 .  aspirin 81 MG tablet, Take 81 mg by mouth daily., Disp: , Rfl:  .  cholecalciferol (VITAMIN D) 400 units TABS tablet, Take 400 Units by mouth., Disp: , Rfl:  .  Coenzyme Q10 (CO Q 10) 100 MG CAPS, Take 100 mg by mouth., Disp: , Rfl:  .  glipiZIDE (GLUCOTROL) 10 MG tablet, Take 1 tablet (10 mg total) by mouth 2 (two) times daily before a meal., Disp: 180 tablet, Rfl: 1 .  glucose blood (ACCU-CHEK SMARTVIEW) test strip, Use as directed to check blood sugar., Disp: 100 each, Rfl: 3 .  lisinopril (PRINIVIL,ZESTRIL) 2.5 MG tablet, Take 1 tablet (2.5 mg total) by mouth daily., Disp: 90 tablet, Rfl: 1 .  metFORMIN (GLUCOPHAGE) 1000 MG tablet, Take 1 tablet (1,000 mg total) by mouth 2 (two) times daily with a meal., Disp: 180 tablet, Rfl: 1 .  Omega-3 Fatty Acids (FISH OIL) 1200 MG CPDR, Take 1,200 mg by mouth., Disp: , Rfl:  .  pravastatin (PRAVACHOL)  40 MG tablet, TAKE 1 TABLET (40 MG TOTAL) BY MOUTH DAILY., Disp: 90 tablet, Rfl: 1 .  sitaGLIPtin (JANUVIA) 100 MG tablet, Take 1 tablet (100 mg total) by mouth daily., Disp: 90 tablet, Rfl: 1  EXAM:  Vitals:   06/02/16 1548  BP: 120/80  Pulse: 84  Temp: 98.3 F (36.8 C)    Body mass index is 31.17 kg/m.  GENERAL: vitals reviewed and listed above, alert, oriented, appears well hydrated and in no acute distress  HEENT: atraumatic, conjunttiva clear, no obvious abnormalities on inspection of external nose and ears  NECK: no obvious masses on inspection  LUNGS: clear to auscultation bilaterally, no wheezes, rales or rhonchi, good air movement  CV: HRRR, no peripheral edema  MS: moves all extremities without noticeable abnormality, normal inspection head/neck/shoulder and upper back  PSYCH: pleasant and cooperative, no obvious depression or  anxiety  ASSESSMENT AND PLAN:  Discussed the following assessment and plan:  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  There are no Patient Instructions on file for this visit.  Kriste Basque R., DO

## 2016-06-02 NOTE — Patient Instructions (Signed)
BEFORE YOU LEAVE: -follow up: 3-4 months -upper back exercises  Do the back exercises 3 days per week. Follow up if worsening, new or persistent symptoms.  We have ordered labs or studies at this visit. It can take up to 1-2 weeks for results and processing. IF results require follow up or explanation, we will call you with instructions. Clinically stable results will be released to your [facility]. If you have not heard from [place] or cannot find your results in [facility] in 2 weeks please contact our office at [phone].  If you are not yet signed up for [facility], please consider signing up.  We recommend the following healthy lifestyle for LIFE: 1) Small portions.   Tip: eat off of a salad plate instead of a dinner plate.  Tip: It is ok to feel hungry after a meal   Tip: if you need more or a snack choose fruits, veggies and/or a handful of nuts or seeds.  2) Eat a healthy clean diet.  * Tip: Avoid (less then 1 serving per week): processed foods, sweets, sweetened drinks, white starches (rice, flour, bread, potatoes, pasta, etc), red meat, fast foods, butter  *Tip: CHOOSE instead   * 5-9 servings per day of fresh or frozen fruits and vegetables (but not corn, potatoes, bananas, canned or dried fruit)   *nuts and seeds, beans   *olives and olive oil   *small portions of lean meats such as fish and white chicken    *small portions of whole grains  3)Get at least 150 minutes of sweaty aerobic exercise per week.  4)Reduce stress - consider counseling, meditation and relaxation to balance other aspects of your life.

## 2016-06-02 NOTE — Progress Notes (Signed)
Pre visit review using our clinic review tool, if applicable. No additional management support is needed unless otherwise documented below in the visit note. 

## 2016-06-03 LAB — HEMOGLOBIN A1C
HEMOGLOBIN A1C: 7.2 % — AB (ref <5.7)
MEAN PLASMA GLUCOSE: 160 mg/dL

## 2016-08-05 ENCOUNTER — Other Ambulatory Visit: Payer: Self-pay | Admitting: Family Medicine

## 2016-09-18 ENCOUNTER — Other Ambulatory Visit: Payer: Self-pay | Admitting: Family Medicine

## 2016-10-03 ENCOUNTER — Other Ambulatory Visit: Payer: Self-pay | Admitting: *Deleted

## 2016-10-03 MED ORDER — GLIPIZIDE 10 MG PO TABS: 10.0000 mg | ORAL_TABLET | Freq: Two times a day (BID) | ORAL | 1 refills | Status: DC

## 2016-10-03 MED ORDER — METFORMIN HCL 1000 MG PO TABS: 1000.0000 mg | ORAL_TABLET | Freq: Two times a day (BID) | ORAL | 1 refills | Status: DC

## 2016-10-03 NOTE — Progress Notes (Signed)
HPI:  Due for eye exam, flu vaccine, labs. Reports overall he is doing well. He ran out of his glipizide and his metformin 3 days ago due to a mail pharmacy issue.however he reports this has been taken care of and he got a short prescription and local pharmacy until his mail order comes in. No regular exercise. No changes in the diet. He occasionally has leg cramps at night. This is rare. He has never done a diabetic eye exam. Agrees to schedule.  Diabetes mellitus with kidney complications: -History of poor compliance -Medications include:aspirin, glipizide, Januvia, metformin and ACE inhibitor -endocrinologist added pioglitazone - no longer taking  Hypertension in the setting of diabetes: -Medications include: Lisinopril  Hyperlipidemia in the setting of diabetes: -Medications include: fish oil, pravastatin  Obstructive sleep apnea: -Uses CPAP  History of smoking  Elevated blood iron/transaminitis: -Status post evaluation with gastroenterology -Genetic testing for hemachromatosis was negative -He had an ultrasound of the liver and biopsy - that showed no arm deposition, fatty liver and some scarring/fibrosis, he was frustrated with the cost of this evaluation and declined further GI follow-up -Referred to hematology for management of his persistently elevated ferritin and his concerns about when he may need phlebotomy, but he opted not to go or pursue further evaluation or treatment  ROS: See pertinent positives and negatives per HPI.  Past Medical History:  Diagnosis Date  . Abnormal blood level of iron 10/28/2015  . Diabetes mellitus without complication (HCC)   . Hyperlipidemia   . OSA on CPAP   . Transaminitis 06/28/2015   -abnormal [place] -hx alcohol [place] -elevated ferritin, but transferrin sat < 45, hemochromatosis genetic testing negative -referred to GI, advised to quit alcohol, advised healthy lifestyle and control of chronic disease - pt poorly compliant     Past Surgical  History:  Procedure Laterality Date  . NO PAST SURGERIES      Family History  Problem Relation Age of Onset  . Bone cancer Father   . Diabetes Father   . Hypertension Father   . Colon cancer Neg Hx   . Colon polyps Neg Hx   . Kidney disease Neg Hx   . Esophageal cancer Neg Hx   . Heart disease Neg Hx     Social History   Social History  . Marital status: Married    Spouse name: N/A  . Number of children: 2  . Years of education: N/A   Occupational History  . Professor     [employer]   Social History Main Topics  . Smoking status: Light Tobacco Smoker    Packs/day: 0.25    Years: 20.00    Types: Cigarettes  . Smokeless tobacco: Former Neurosurgeon    Types: Chew     Comment:  1-2 cig per week  . Alcohol use 0.0 oz/week     Comment: rarely  . Drug use: No  . Sexual activity: Not Asked   Other Topics Concern  . 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  ACCU-CHEK FASTCLIX LANCETS MISC, Test twice daily., Disp: 102 each, Rfl: 0 .  aspirin 81 MG tablet, Take 81 mg by mouth daily., Disp: ,  Rfl:  .  cholecalciferol (VITAMIN D) 400 units TABS tablet, Take 400 Units by mouth., Disp: , Rfl:  .  Coenzyme Q10 (CO Q 10) 100 MG CAPS, Take 100 mg by mouth., Disp: , Rfl:  .  glipiZIDE (GLUCOTROL) 10 MG tablet, Take 1 tablet (10 mg total) by mouth 2 (two) times daily before a meal., Disp: 14 tablet, Rfl: 0 .  glucose blood (ACCU-CHEK SMARTVIEW) test strip, Use as directed to check blood sugar., Disp: 100 each, Rfl: 3 .  JANUVIA 100 MG tablet, TAKE 1 TABLET DAILY, Disp: 90 tablet, Rfl: 1 .  lisinopril (PRINIVIL,ZESTRIL) 2.5 MG tablet, TAKE 1 TABLET DAILY, Disp: 90 tablet, Rfl: 1 .  metFORMIN (GLUCOPHAGE) 1000 MG tablet, Take 1 tablet (1,000 mg total) by mouth 2  (two) times daily with a meal., Disp: 14 tablet, Rfl: 0 .  Omega-3 Fatty Acids (FISH OIL) 1200 MG CPDR, Take 1,200 mg by mouth., Disp: , Rfl:  .  pravastatin (PRAVACHOL) 40 MG tablet, TAKE 1 TABLET (40 MG TOTAL) BY MOUTH DAILY., Disp: 90 tablet, Rfl: 1  EXAM:  Vitals:   10/05/16 1637  BP: 118/82  Pulse: 81  Temp: 98.2 F (36.8 C)    Body mass index is 29.95 kg/m.  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  Patient Instructions  BEFORE YOU LEAVE: -follow up: 3-4 months -labs  Schedule your diabetic eye exam  We have ordered labs or studies at this visit. It can take up to 1-2 weeks for results and processing. IF results require follow up or explanation, we will call you with instructions. Clinically stable results will be released to your [facility]. If you have not heard  from [place] or cannot find your results in [facility] in 2 weeks please contact our office at [phone].  If you are not yet signed up for [facility], please consider signing up.   We recommend the following healthy lifestyle for LIFE: 1) Small portions.    2) Eat a healthy clean diet.   TRY TO EAT: -at least 5-7 servings of low sugar vegetables per day (not corn, potatoes or bananas.) -berries are the best choice if you wish to eat fruit.   -lean meets (fish, chicken or [place] breasts) -vegan proteins for some meals - beans or tofu, whole grains, nuts and seeds -Replace bad fats with good fats - good fats include: fish, nuts and seeds, canola oil, olive oil -small amounts of low fat or non fat dairy -small amounts of100 % whole grains - check  the lables  AVOID: -SUGAR, sweets, anything with added sugar, corn syrup or sweeteners -if you must have a sweetener, small amounts of stevia may be best -sweetened beverages -simple starches (rice, bread, potatoes, pasta, chips, etc - small amounts of 100% whole grains are ok) -red meat, pork, butter -fried foods, fast food, processed food, excessive dairy, eggs and coconut.  3)Get at least 150 minutes of sweaty aerobic exercise per week.  4)Reduce stress - consider counseling, meditation and relaxation to balance other aspects of your life.  5) avoid harmful toxins - no smoking, excessive alcohol, processed foods, etc  WE NOW OFFER   [facility] FAST TRACK!!!  SAME DAY Appointments for ACUTE CARE  Such as: Sprains, Injuries, cuts, abrasions, rashes, muscle pain, joint pain, back pain Colds, flu, sore throats, headache, allergies, cough, fever  Ear pain, sinus and eye infections Abdominal pain, nausea, vomiting, diarrhea, upset stomach Animal/insect bites  3 Easy Ways to Schedule: Walk-In Scheduling Call in scheduling Mychart Sign-up: [email or web address]                Kriste Basque R., DO

## 2016-10-03 NOTE — Telephone Encounter (Signed)
Rx done. 

## 2016-10-04 ENCOUNTER — Telehealth: Payer: Self-pay | Admitting: Family Medicine

## 2016-10-04 NOTE — Telephone Encounter (Signed)
Pt need new Rx for glipizide#14 and metformin #14 he has been out of these medications for 2 days and would like to see if Dr. Selena Batten could call him in enough until he get his Rx from the mail order on 10/15/16.  Pharm:  [facility] in [place]  Pt is aware that Dr. Selena Batten is out of the office and he would like to have a call back when medication is called in.

## 2016-10-05 ENCOUNTER — Ambulatory Visit (INDEPENDENT_AMBULATORY_CARE_PROVIDER_SITE_OTHER): Payer: BC Managed Care – PPO | Admitting: Family Medicine

## 2016-10-05 ENCOUNTER — Encounter: Payer: Self-pay | Admitting: Family Medicine

## 2016-10-05 VITALS — BP 118/82 | HR 81 | Temp 98.2°F | Ht 72.0 in | Wt 220.8 lb

## 2016-10-05 DIAGNOSIS — E1169 Type 2 diabetes mellitus with other specified complication: Secondary | ICD-10-CM | POA: Diagnosis not present

## 2016-10-05 DIAGNOSIS — G4733 Obstructive sleep apnea (adult) (pediatric): Secondary | ICD-10-CM | POA: Diagnosis not present

## 2016-10-05 DIAGNOSIS — E1165 Type 2 diabetes mellitus with hyperglycemia: Secondary | ICD-10-CM

## 2016-10-05 DIAGNOSIS — IMO0002 Reserved for concepts with insufficient information to code with codable children: Secondary | ICD-10-CM

## 2016-10-05 DIAGNOSIS — E785 Hyperlipidemia, unspecified: Secondary | ICD-10-CM

## 2016-10-05 DIAGNOSIS — E1129 Type 2 diabetes mellitus with other diabetic kidney complication: Secondary | ICD-10-CM | POA: Diagnosis not present

## 2016-10-05 DIAGNOSIS — E1159 Type 2 diabetes mellitus with other circulatory complications: Secondary | ICD-10-CM | POA: Diagnosis not present

## 2016-10-05 DIAGNOSIS — I152 Hypertension secondary to endocrine disorders: Secondary | ICD-10-CM

## 2016-10-05 DIAGNOSIS — I10 Essential (primary) hypertension: Secondary | ICD-10-CM

## 2016-10-05 MED ORDER — GLIPIZIDE 10 MG PO TABS
10.0000 mg | ORAL_TABLET | Freq: Two times a day (BID) | ORAL | 0 refills | Status: DC
Start: 2016-10-05 — End: 2017-07-05

## 2016-10-05 MED ORDER — METFORMIN HCL 1000 MG PO TABS: 1000.0000 mg | ORAL_TABLET | Freq: Two times a day (BID) | ORAL | 0 refills | Status: DC

## 2016-10-05 NOTE — Patient Instructions (Signed)
BEFORE YOU LEAVE: -follow up: 3-4 months -labs  Schedule your diabetic eye exam  We have ordered labs or studies at this visit. It can take up to 1-2 weeks for results and processing. IF results require follow up or explanation, we will call you with instructions. Clinically stable results will be released to your [facility] IMYCHART. If you have not heard from us or cannot find your results in [facility] in 2 weeks please contact our office at [phone].  If you are not yet signed up for [facility], please consider signing up.   We recommend the following healthy lifestyle for LIFE: 1) Small portions.    2) Eat a healthy clean diet.   TRY TO EAT: -at least 5-7 servings of low sugar vegetables per day (not corn, potatoes or bananas.) -berries are the best choice if you wish to eat fruit.   -lean meets (fish, chicken or [place] breasts) -vegan proteins for some meals - beans or tofu, whole grains, nuts and seeds -Replace bad fats with good fats - good fats include: fish, nuts and seeds, canola oil, olive oil -small amounts of low fat or non fat dairy -small amounts of100 % whole grains - check the lables  AVOID: -SUGAR, sweets, anything with added sugar, corn syrup or sweeteners -if you must have a sweetener, small amounts of stevia may be best -sweetened beverages -simple starches (rice, bread, potatoes, pasta, chips, etc - small amounts of 100% whole grains are ok) -red meat, pork, butter -fried foods, fast food, processed food, excessive dairy, eggs and coconut.  3)Get at least 150 minutes of sweaty aerobic exercise per week.  4)Reduce stress - consider counseling, meditation and relaxation to balance other aspects of your life.  5) avoid harmful toxins - no smoking, excessive alcohol, processed foods, etc  WE NOW OFFER   [facility] FAST TRACK!!!  SAME DAY Appointments for ACUTE CARE  Such as: Sprains, Injuries, cuts, abrasions, rashes, muscle pain, joint pain, back  pain Colds, flu, sore throats, headache, allergies, cough, fever  Ear pain, sinus and eye infections Abdominal pain, nausea, vomiting, diarrhea, upset stomach Animal/insect bites  3 Easy Ways to Schedule: Walk-In Scheduling Call in scheduling Mychart Sign-up: [email or web address]

## 2016-10-05 NOTE — Telephone Encounter (Signed)
Rx done and I called the pt and informed him of this. 

## 2016-10-06 LAB — CBC
HEMATOCRIT: 49.7 % (ref 39.0–52.0)
Hemoglobin: 16.9 g/dL (ref 13.0–17.0)
MCHC: 33.9 g/dL (ref 30.0–36.0)
MCV: 88.5 fl (ref 78.0–100.0)
Platelets: 148 10*3/uL — ABNORMAL LOW (ref 150.0–400.0)
RBC: 5.62 Mil/uL (ref 4.22–5.81)
RDW: 13.7 % (ref 11.5–15.5)
WBC: 6.7 10*3/uL (ref 4.0–10.5)

## 2016-10-06 LAB — BASIC METABOLIC PANEL
BUN: 13 mg/dL (ref 6–23)
CHLORIDE: 97 meq/L (ref 96–112)
CO2: 26 meq/L (ref 19–32)
CREATININE: 1.01 mg/dL (ref 0.40–1.50)
Calcium: 10.3 mg/dL (ref 8.4–10.5)
GFR: 83.61 mL/min (ref >60.00)
GLUCOSE: 230 mg/dL — AB (ref 70–99)
Potassium: 4.3 mEq/L (ref 3.5–5.1)
Sodium: 134 mEq/L — ABNORMAL LOW (ref 135–145)

## 2016-10-06 LAB — HEMOGLOBIN A1C: HEMOGLOBIN A1C: 8.7 % — AB (ref 4.6–6.5)

## 2016-10-10 ENCOUNTER — Other Ambulatory Visit: Payer: Self-pay | Admitting: Family Medicine

## 2016-10-10 MED ORDER — PRAVASTATIN SODIUM 40 MG PO TABS
40.0000 mg | ORAL_TABLET | Freq: Every day | ORAL | 1 refills | Status: DC
Start: 2016-10-10 — End: 2017-12-22

## 2016-10-10 NOTE — Telephone Encounter (Signed)
Rx resent.

## 2016-10-10 NOTE — Addendum Note (Signed)
Addended by: Johnella MoloneyFUNDERBURK, JO A on: 10/10/2016 01:19 PM   Modules accepted: Orders

## 2016-10-26 ENCOUNTER — Encounter: Payer: Self-pay | Admitting: Pediatrics

## 2016-10-26 ENCOUNTER — Encounter: Payer: Self-pay | Admitting: Family Medicine

## 2016-10-27 ENCOUNTER — Other Ambulatory Visit: Payer: Self-pay

## 2016-10-27 MED ORDER — PIOGLITAZONE HCL 15 MG PO TABS: ORAL_TABLET | ORAL | 3 refills | Status: DC

## 2017-01-19 ENCOUNTER — Other Ambulatory Visit: Payer: Self-pay | Admitting: *Deleted

## 2017-01-19 MED ORDER — PIOGLITAZONE HCL 15 MG PO TABS: ORAL_TABLET | ORAL | 3 refills | Status: DC

## 2017-01-19 NOTE — Telephone Encounter (Signed)
Rx done. 

## 2017-01-20 ENCOUNTER — Other Ambulatory Visit: Payer: Self-pay | Admitting: Family Medicine

## 2017-02-07 NOTE — Progress Notes (Signed)
HPI:  David Grant is a pleasant 47 y.o. here for follow up. Chronic medical problems summarized below were reviewed for changes. Sadly, he has a hx of poor compliance with recommendations and follow up advice. Diabetes was uncontrolled on last check, he did not follow up as advised. Reports has not taken several medication for the last week or so [place] daily. Reports poor diet and not much exercise. Reports using cpap.Denies CP, SOB, DOE, treatment intolerance or new symptoms. Due for foot exam, labs, eye exam, flu shot (refused in the past)  Diabetes mellitus with kidney complications: -History of poor compliance -Medications include:aspirin, glipizide, Januvia, metformin and ACE inhibitor -endocrinologist added pioglitazone, restarted 1/2 tablet last check  Hypertension in the setting of diabetes: -Medications include: Lisinopril  Hyperlipidemia in the setting of diabetes: -Medications include: fish oil, pravastatin  Obstructive sleep apnea: -Uses CPAP  History of smoking  Elevated blood iron/transaminitis: -Status post evaluation with gastroenterology -Genetic testing for hemachromatosis was negative -He had an ultrasound of the liver and biopsy that showed no iron deposition, fatty liver and some scarring/fibrosis, he was frustrated with the cost of this evaluation and declined further GI follow-up -Referred to hematology for management of his persistently elevated ferritin and his concerns about when he may need phlebotomy, but he opted not to go or pursue further evaluation or treatment  ROS: See pertinent positives and negatives per HPI.  Past Medical History:  Diagnosis Date  . Abnormal blood level of iron 10/28/2015  . Diabetes mellitus without complication (HCC)   . Hyperlipidemia   . OSA on CPAP   . Transaminitis 06/28/2015   -abnormal US -hx alcohol us -elevated ferritin, but transferrin sat < 45, hemochromatosis  genetic testing negative -referred to GI, advised to quit alcohol, advised healthy lifestyle and control of chronic disease - pt poorly compliant     Past Surgical History:  Procedure Laterality Date  . NO PAST SURGERIES      Family History  Problem Relation Age of Onset  . Bone cancer Father   . Diabetes Father   . Hypertension Father   . Colon cancer Neg Hx   . Colon polyps Neg Hx   . Kidney disease Neg Hx   . Esophageal cancer Neg Hx   . Heart disease Neg Hx     Social History   Socioeconomic History  . Marital status: Married    Spouse name: None  . Number of children: 2  . Years of education: None  . Highest education level: None  Social Needs  . Financial resource strain: None  . Food insecurity - worry: None  . Food insecurity - inability: None  . Transportation needs - medical: None  . Transportation needs - non-medical: None  Occupational History  . Occupation: Professor    Comment[employer]  Tobacco Use  . Smoking status: Light Tobacco Smoker    Packs/day: 0.25    Years: 20.00    Pack years: 5.00    Types: Cigarettes  . Smokeless tobacco: Former NeurosurgeonUser    Types: Chew  . Tobacco comment:  1-2 cig per week  Substance and Sexual Activity  . Alcohol use: Yes    Alcohol/week: 0.0 oz    Comment: rarely  . Drug use: No  . Sexual activity: None  Other Topics Concern  . 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  ACCU-CHEK FASTCLIX LANCETS MISC, Test twice daily., Disp: 102 each, Rfl: 0 .  aspirin 81 MG tablet, Take 81 mg by mouth daily., Disp: , Rfl:  .  cholecalciferol (VITAMIN D) 400 units TABS tablet, Take 400 Units by mouth., Disp: , Rfl:  .  Coenzyme Q10 (CO Q 10) 100 MG CAPS, Take 100 mg by mouth., Disp: , Rfl:  .  glipiZIDE  (GLUCOTROL) 10 MG tablet, Take 1 tablet (10 mg total) by mouth 2 (two) times daily before a meal., Disp: 14 tablet, Rfl: 0 .  glucose blood (ACCU-CHEK SMARTVIEW) test strip, Use as directed to check blood sugar., Disp: 100 each, Rfl: 3 .  JANUVIA 100 MG tablet, TAKE 1 TABLET DAILY, Disp: 90 tablet, Rfl: 1 .  lisinopril (PRINIVIL,ZESTRIL) 2.5 MG tablet, TAKE 1 TABLET DAILY, Disp: 90 tablet, Rfl: 1 .  metFORMIN (GLUCOPHAGE) 1000 MG tablet, Take 1 tablet (1,000 mg total) by mouth 2 (two) times daily with a meal., Disp: 14 tablet, Rfl: 0 .  Omega-3 Fatty Acids (FISH OIL) 1200 MG CPDR, Take 1,200 mg by mouth., Disp: , Rfl:  .  pioglitazone (ACTOS) 15 MG tablet, Take 1/2 tablet once daily, Disp: 45 tablet, Rfl: 3 .  pravastatin (PRAVACHOL) 40 MG tablet, Take 1 tablet (40 mg total) by mouth daily., Disp: 90 tablet, Rfl: 1  EXAM:  Vitals:   02/08/17 1636  BP: 110/80  Pulse: 86  Temp: 98.3 F (36.8 C)    Body mass index is 30.49 kg/m.  GENERAL: vitals reviewed and listed above, alert, oriented, appears well hydrated and in no acute distress  HEENT: atraumatic, conjunttiva clear, no obvious abnormalities on inspection of external nose and ears  NECK: no obvious masses on inspection  LUNGS: clear to auscultation bilaterally, no wheezes, rales or rhonchi, good air movement  CV: HRRR, no peripheral edema  MS: moves all extremities without noticeable abnormality  PSYCH: pleasant and cooperative, no obvious depression or anxiety  ASSESSMENT AND PLAN:  Discussed the following assessment and plan:  Type 2 diabetes, uncontrolled, with renal manifestation (HCC) - Plan: Hemoglobin A1c  Hypertension associated with diabetes (HCC) - Plan: Basic metabolic panel, CBC  Hyperlipidemia associated with type 2 diabetes mellitus (HCC)  OSA (obstructive sleep apnea)  Elevated ferritin - Plan: Ferritin  Transaminitis - Plan: Hepatic function panel  -refused flu shot -agrees to schedule eye  exam -declines labs today, wants me to order for in a few weeks once back on meds -advised healthy diet, regular exercise, avoid alcohol -foot exam done -Patient advised to return or notify a doctor immediately if symptoms worsen or persist or new concerns arise.  Patient Instructions  BEFORE YOU LEAVE: -follow up:  1) lab visit in 2 weeks 2) follow up 4 months  Avoid alcohol and tobacco.  Get your diabetic eye exam.   We recommend the following healthy lifestyle for LIFE: 1) Small portions. But, make sure to get regular (at least 3 per day), healthy meals and small healthy snacks if needed.  2) Eat a healthy clean diet.   TRY TO EAT: -at least 5-7 servings of low sugar, colorful, and nutrient rich vegetables per day (not corn, potatoes or bananas.) -berries are the best choice if you wish to eat  fruit (only eat small amounts if trying to reduce weight)  -lean meets (fish, white meat of chicken or [place]) -vegan proteins for some meals - beans or tofu, whole grains, nuts and seeds -Replace bad fats with good fats - good fats include: fish, nuts and seeds, canola oil, olive oil -small amounts of low fat or non fat dairy -small amounts of100 % whole grains - check the lables -drink plenty of water  AVOID: -SUGAR, sweets, anything with added sugar, corn syrup or sweeteners - must read labels as even foods advertised as "healthy" often are loaded with sugar -if you must have a sweetener, small amounts of stevia may be best -sweetened beverages and artificially sweetened beverages -simple starches (rice, bread, potatoes, pasta, chips, etc - small amounts of 100% whole grains are ok) -red meat, pork, butter -fried foods, fast food, processed food, excessive dairy, eggs and coconut.  3)Get at least 150 minutes of sweaty aerobic exercise per week.  4)Reduce stress - consider counseling, meditation and relaxation to balance other aspects of your life.    Kriste Basque R., DO

## 2017-02-08 ENCOUNTER — Encounter: Payer: Self-pay | Admitting: Family Medicine

## 2017-02-08 ENCOUNTER — Ambulatory Visit: Payer: BC Managed Care – PPO | Admitting: Family Medicine

## 2017-02-08 VITALS — BP 110/80 | HR 86 | Temp 98.3°F | Ht 72.0 in | Wt 224.8 lb

## 2017-02-08 DIAGNOSIS — E1169 Type 2 diabetes mellitus with other specified complication: Secondary | ICD-10-CM

## 2017-02-08 DIAGNOSIS — E1165 Type 2 diabetes mellitus with hyperglycemia: Secondary | ICD-10-CM | POA: Diagnosis not present

## 2017-02-08 DIAGNOSIS — E785 Hyperlipidemia, unspecified: Secondary | ICD-10-CM

## 2017-02-08 DIAGNOSIS — R7401 Elevation of levels of liver transaminase levels: Secondary | ICD-10-CM

## 2017-02-08 DIAGNOSIS — IMO0002 Reserved for concepts with insufficient information to code with codable children: Secondary | ICD-10-CM

## 2017-02-08 DIAGNOSIS — E1129 Type 2 diabetes mellitus with other diabetic kidney complication: Secondary | ICD-10-CM

## 2017-02-08 DIAGNOSIS — G4733 Obstructive sleep apnea (adult) (pediatric): Secondary | ICD-10-CM | POA: Diagnosis not present

## 2017-02-08 DIAGNOSIS — R740 Nonspecific elevation of levels of transaminase and lactic acid dehydrogenase [LDH]: Secondary | ICD-10-CM | POA: Diagnosis not present

## 2017-02-08 DIAGNOSIS — E1159 Type 2 diabetes mellitus with other circulatory complications: Secondary | ICD-10-CM

## 2017-02-08 DIAGNOSIS — R7989 Other specified abnormal findings of blood chemistry: Secondary | ICD-10-CM | POA: Diagnosis not present

## 2017-02-08 DIAGNOSIS — I152 Hypertension secondary to endocrine disorders: Secondary | ICD-10-CM

## 2017-02-08 DIAGNOSIS — I10 Essential (primary) hypertension: Secondary | ICD-10-CM

## 2017-02-08 MED ORDER — GLUCOSE BLOOD VI STRP: ORAL_STRIP | 3 refills | Status: DC

## 2017-02-08 MED ORDER — ACCU-CHEK FASTCLIX LANCETS MISC
3 refills | Status: AC
Start: 2017-02-08 — End: ?

## 2017-02-08 NOTE — Patient Instructions (Addendum)
BEFORE YOU LEAVE: -follow up:  1) lab visit in 2 weeks 2) follow up 4 months  Avoid alcohol and tobacco.  Get your diabetic eye exam.   We recommend the following healthy lifestyle for LIFE: 1) Small portions. But, make sure to get regular (at least 3 per day), healthy meals and small healthy snacks if needed.  2) Eat a healthy clean diet.   TRY TO EAT: -at least 5-7 servings of low sugar, colorful, and nutrient rich vegetables per day (not corn, potatoes or bananas.) -berries are the best choice if you wish to eat fruit (only eat small amounts if trying to reduce weight)  -lean meets (fish, white meat of chicken or [place]) -vegan proteins for some meals - beans or tofu, whole grains, nuts and seeds -Replace bad fats with good fats - good fats include: fish, nuts and seeds, canola oil, olive oil -small amounts of low fat or non fat dairy -small amounts of100 % whole grains - check the lables -drink plenty of water  AVOID: -SUGAR, sweets, anything with added sugar, corn syrup or sweeteners - must read labels as even foods advertised as "healthy" often are loaded with sugar -if you must have a sweetener, small amounts of stevia may be best -sweetened beverages and artificially sweetened beverages -simple starches (rice, bread, potatoes, pasta, chips, etc - small amounts of 100% whole grains are ok) -red meat, pork, butter -fried foods, fast food, processed food, excessive dairy, eggs and coconut.  3)Get at least 150 minutes of sweaty aerobic exercise per week.  4)Reduce stress - consider counseling, meditation and relaxation to balance other aspects of your life.

## 2017-02-12 ENCOUNTER — Other Ambulatory Visit: Payer: Self-pay | Admitting: Family Medicine

## 2017-02-14 ENCOUNTER — Telehealth: Payer: Self-pay | Admitting: Family Medicine

## 2017-02-14 NOTE — Telephone Encounter (Signed)
Copied from CRM [phone]. Topic: General - Other >> Feb 14, 2017  9:12 AM Percival SpanishKennedy, Cheryl W wrote:  [facility] mail ordrer cal lto say pt insurance does not cover  glucose blood (ACCU-CHEK SMARTVIEW) test strip  Insurance will cover ultra and verio  Pharmacy would like a call back about what to change to

## 2017-02-15 ENCOUNTER — Encounter: Payer: Self-pay | Admitting: Family Medicine

## 2017-02-15 MED ORDER — GLUCOSE BLOOD VI STRP: ORAL_STRIP | 5 refills | Status: DC

## 2017-02-15 MED ORDER — ONETOUCH VERIO W/DEVICE KIT: 1.0000 | PACK | Freq: Once | 0 refills | Status: AC

## 2017-02-15 MED ORDER — ONETOUCH ULTRASOFT LANCETS MISC: 12 refills | Status: DC

## 2017-02-15 NOTE — Telephone Encounter (Signed)
Rx for [employer], test strips and lancets was sent to [facility] via escribe.

## 2017-02-22 NOTE — Addendum Note (Signed)
Addended by: Charna ElizabethLEMMONS, STEPHANIE L on: 02/22/2017 10:31 AM   Modules accepted: Orders

## 2017-02-23 ENCOUNTER — Other Ambulatory Visit (INDEPENDENT_AMBULATORY_CARE_PROVIDER_SITE_OTHER): Payer: BC Managed Care – PPO

## 2017-02-23 DIAGNOSIS — E1129 Type 2 diabetes mellitus with other diabetic kidney complication: Secondary | ICD-10-CM

## 2017-02-23 DIAGNOSIS — I152 Hypertension secondary to endocrine disorders: Secondary | ICD-10-CM

## 2017-02-23 DIAGNOSIS — R7989 Other specified abnormal findings of blood chemistry: Secondary | ICD-10-CM

## 2017-02-23 DIAGNOSIS — R740 Nonspecific elevation of levels of transaminase and lactic acid dehydrogenase [LDH]: Principal | ICD-10-CM

## 2017-02-23 DIAGNOSIS — R7401 Elevation of levels of liver transaminase levels: Secondary | ICD-10-CM

## 2017-02-23 DIAGNOSIS — I10 Essential (primary) hypertension: Secondary | ICD-10-CM

## 2017-02-23 DIAGNOSIS — E1159 Type 2 diabetes mellitus with other circulatory complications: Secondary | ICD-10-CM

## 2017-02-23 LAB — FERRITIN: Ferritin: 902 ng/mL — ABNORMAL HIGH (ref 20–380)

## 2017-03-02 ENCOUNTER — Other Ambulatory Visit (INDEPENDENT_AMBULATORY_CARE_PROVIDER_SITE_OTHER): Payer: BC Managed Care – PPO

## 2017-03-02 ENCOUNTER — Telehealth: Payer: Self-pay | Admitting: Family Medicine

## 2017-03-02 DIAGNOSIS — R7401 Elevation of levels of liver transaminase levels: Secondary | ICD-10-CM

## 2017-03-02 DIAGNOSIS — E1165 Type 2 diabetes mellitus with hyperglycemia: Principal | ICD-10-CM

## 2017-03-02 DIAGNOSIS — R740 Nonspecific elevation of levels of transaminase and lactic acid dehydrogenase [LDH]: Secondary | ICD-10-CM | POA: Diagnosis not present

## 2017-03-02 DIAGNOSIS — E1129 Type 2 diabetes mellitus with other diabetic kidney complication: Secondary | ICD-10-CM

## 2017-03-02 DIAGNOSIS — R7989 Other specified abnormal findings of blood chemistry: Secondary | ICD-10-CM

## 2017-03-02 DIAGNOSIS — I10 Essential (primary) hypertension: Secondary | ICD-10-CM

## 2017-03-02 DIAGNOSIS — IMO0002 Reserved for concepts with insufficient information to code with codable children: Secondary | ICD-10-CM

## 2017-03-02 DIAGNOSIS — E1159 Type 2 diabetes mellitus with other circulatory complications: Secondary | ICD-10-CM

## 2017-03-02 MED ORDER — GLUCOSE BLOOD VI STRP: ORAL_STRIP | 3 refills | Status: AC

## 2017-03-02 NOTE — Telephone Encounter (Signed)
Rx done. 

## 2017-03-02 NOTE — Telephone Encounter (Signed)
Patient needs strips called in for the Accu-Check Smartview blood glucose monitor. Patient needs Onetouch Verio strips not to be called in in the future.  Pharmacy: [facility]

## 2017-03-02 NOTE — Addendum Note (Signed)
Addended by: Bonnye FavaKWEI, NANA K on: 03/02/2017 03:42 PM   Modules accepted: Orders

## 2017-03-03 LAB — CBC WITH DIFFERENTIAL/PLATELET
Basophils Absolute: 62 cells/uL (ref 0–200)
Basophils Relative: 0.9 %
Eosinophils Absolute: 152 cells/uL (ref 15–500)
Eosinophils Relative: 2.2 %
HCT: 49.5 % (ref 38.5–50.0)
Hemoglobin: 17.6 g/dL — ABNORMAL HIGH (ref 13.2–17.1)
Lymphs Abs: 2801 cells/uL (ref 850–3900)
MCH: 30.5 pg (ref 27.0–33.0)
MCHC: 35.6 g/dL (ref 32.0–36.0)
MCV: 85.8 fL (ref 80.0–100.0)
MONOS PCT: 9.7 %
MPV: 12.4 fL (ref 7.5–12.5)
NEUTROS PCT: 46.6 %
Neutro Abs: 3215 cells/uL (ref 1500–7800)
PLATELETS: 171 10*3/uL (ref 140–400)
RBC: 5.77 10*6/uL (ref 4.20–5.80)
RDW: 13 % (ref 11.0–15.0)
TOTAL LYMPHOCYTE: 40.6 %
WBC: 6.9 10*3/uL (ref 3.8–10.8)
WBCMIX: 669 {cells}/uL (ref 200–950)

## 2017-03-03 LAB — HEPATIC FUNCTION PANEL
AG Ratio: 1.8 (calc) (ref 1.0–2.5)
ALBUMIN MSPROF: 4.9 g/dL (ref 3.6–5.1)
ALKALINE PHOSPHATASE (APISO): 61 U/L (ref 40–115)
ALT: 78 U/L — ABNORMAL HIGH (ref 9–46)
AST: 53 U/L — AB (ref 10–40)
BILIRUBIN DIRECT: 0.1 mg/dL (ref 0.0–0.2)
BILIRUBIN INDIRECT: 0.4 mg/dL (ref 0.2–1.2)
BILIRUBIN TOTAL: 0.5 mg/dL (ref 0.2–1.2)
Globulin: 2.7 g/dL (calc) (ref 1.9–3.7)
Total Protein: 7.6 g/dL (ref 6.1–8.1)

## 2017-03-03 LAB — BASIC METABOLIC PANEL
BUN: 19 mg/dL (ref 7–25)
CHLORIDE: 97 mmol/L — AB (ref 98–110)
CO2: 27 mmol/L (ref 20–32)
CREATININE: 1.03 mg/dL (ref 0.60–1.35)
Calcium: 10.1 mg/dL (ref 8.6–10.3)
Glucose, Bld: 182 mg/dL — ABNORMAL HIGH (ref 65–99)
POTASSIUM: 4.3 mmol/L (ref 3.5–5.3)
SODIUM: 135 mmol/L (ref 135–146)

## 2017-03-03 LAB — HEMOGLOBIN A1C
Hgb A1c MFr Bld: 7.8 % of total Hgb — ABNORMAL HIGH (ref <5.7)
Mean Plasma Glucose: 177 (calc)
eAG (mmol/L): 9.8 (calc)

## 2017-03-05 ENCOUNTER — Other Ambulatory Visit: Payer: Self-pay | Admitting: *Deleted

## 2017-03-05 MED ORDER — PIOGLITAZONE HCL 15 MG PO TABS: ORAL_TABLET | ORAL | 1 refills | Status: DC

## 2017-03-23 ENCOUNTER — Encounter: Payer: Self-pay | Admitting: Hematology and Oncology

## 2017-03-23 ENCOUNTER — Telehealth: Payer: Self-pay | Admitting: Hematology and Oncology

## 2017-03-23 NOTE — Telephone Encounter (Signed)
Appt has been scheduled for the pt to see Dr. Gweneth DimitriPerlov on 3/15 at 11am. [facility]. Elmyra RicksKim's office for someone to call the pt for clarification of the referral. Letter mailed to the pt.

## 2017-04-07 ENCOUNTER — Other Ambulatory Visit: Payer: Self-pay | Admitting: Family Medicine

## 2017-04-15 ENCOUNTER — Other Ambulatory Visit: Payer: Self-pay | Admitting: Family Medicine

## 2017-04-20 ENCOUNTER — Telehealth: Payer: Self-pay | Admitting: Hematology and Oncology

## 2017-04-20 ENCOUNTER — Inpatient Hospital Stay: Payer: BC Managed Care – PPO | Attending: Hematology and Oncology | Admitting: Hematology and Oncology

## 2017-04-20 ENCOUNTER — Inpatient Hospital Stay: Payer: BC Managed Care – PPO

## 2017-04-20 VITALS — BP 121/92 | HR 106 | Temp 98.6°F | Resp 18 | Ht 72.0 in | Wt 221.5 lb

## 2017-04-20 DIAGNOSIS — R109 Unspecified abdominal pain: Secondary | ICD-10-CM | POA: Diagnosis not present

## 2017-04-20 DIAGNOSIS — R7989 Other specified abnormal findings of blood chemistry: Secondary | ICD-10-CM | POA: Diagnosis not present

## 2017-04-20 DIAGNOSIS — D582 Other hemoglobinopathies: Secondary | ICD-10-CM | POA: Insufficient documentation

## 2017-04-20 DIAGNOSIS — G4733 Obstructive sleep apnea (adult) (pediatric): Secondary | ICD-10-CM | POA: Insufficient documentation

## 2017-04-20 LAB — CMP (CANCER CENTER ONLY)
ALBUMIN: 4.3 g/dL (ref 3.5–5.0)
ALK PHOS: 68 U/L (ref 40–150)
ALT: 85 U/L — ABNORMAL HIGH (ref 0–55)
AST: 57 U/L — AB (ref 5–34)
Anion gap: 8 (ref 3–11)
BILIRUBIN TOTAL: 0.7 mg/dL (ref 0.2–1.2)
BUN: 16 mg/dL (ref 7–26)
CALCIUM: 10.2 mg/dL (ref 8.4–10.4)
CO2: 26 mmol/L (ref 22–29)
Chloride: 102 mmol/L (ref 98–109)
Creatinine: 1.15 mg/dL (ref 0.70–1.30)
GFR, Est AFR Am: >60 mL/min (ref >60)
GLUCOSE: 227 mg/dL — AB (ref 70–140)
Potassium: 4.4 mmol/L (ref 3.5–5.1)
Sodium: 136 mmol/L (ref 136–145)
TOTAL PROTEIN: 7.7 g/dL (ref 6.4–8.3)

## 2017-04-20 LAB — IRON AND TIBC
IRON: 130 ug/dL (ref 42–163)
Saturation Ratios: 38 % — ABNORMAL LOW (ref 42–163)
TIBC: 341 ug/dL (ref 202–409)
UIBC: 211 ug/dL

## 2017-04-20 LAB — CBC WITH DIFFERENTIAL (CANCER CENTER ONLY)
BASOS ABS: 0 10*3/uL (ref 0.0–0.1)
BASOS PCT: 0 %
EOS PCT: 2 %
Eosinophils Absolute: 0.2 10*3/uL (ref 0.0–0.5)
HCT: 50 % — ABNORMAL HIGH (ref 38.4–49.9)
Hemoglobin: 17.4 g/dL — ABNORMAL HIGH (ref 13.0–17.1)
Lymphocytes Relative: 36 %
Lymphs Abs: 2.4 10*3/uL (ref 0.9–3.3)
MCH: 30.3 pg (ref 27.2–33.4)
MCHC: 34.8 g/dL (ref 32.0–36.0)
MCV: 87.1 fL (ref 79.3–98.0)
MONO ABS: 0.5 10*3/uL (ref 0.1–0.9)
Monocytes Relative: 8 %
Neutro Abs: 3.5 10*3/uL (ref 1.5–6.5)
Neutrophils Relative %: 54 %
PLATELETS: 128 10*3/uL — AB (ref 140–400)
RBC: 5.74 MIL/uL (ref 4.20–5.82)
RDW: 13.2 % (ref 11.0–14.6)
WBC Count: 6.5 10*3/uL (ref 4.0–10.3)

## 2017-04-20 LAB — FERRITIN: FERRITIN: 679 ng/mL — AB (ref 22–316)

## 2017-04-20 LAB — LACTATE DEHYDROGENASE: LDH: 168 U/L (ref 125–245)

## 2017-04-20 NOTE — Telephone Encounter (Signed)
Patient declined AVs and calendar of upcoming march appointments. Patient scheduled per 3/15 los.

## 2017-04-21 LAB — ERYTHROPOIETIN: Erythropoietin: 6.3 m[IU]/mL (ref 2.6–18.5)

## 2017-04-27 ENCOUNTER — Inpatient Hospital Stay: Payer: BC Managed Care – PPO | Admitting: Hematology and Oncology

## 2017-04-29 ENCOUNTER — Encounter: Payer: Self-pay | Admitting: Hematology and Oncology

## 2017-04-29 DIAGNOSIS — R7989 Other specified abnormal findings of blood chemistry: Secondary | ICD-10-CM | POA: Insufficient documentation

## 2017-04-29 DIAGNOSIS — D582 Other hemoglobinopathies: Secondary | ICD-10-CM | POA: Insufficient documentation

## 2017-04-29 NOTE — Progress Notes (Signed)
[facility] New Visit:  Assessment: Elevated ferritin level 47 y.o. with elevated transaminases and significantly elevated ferritin level in the context of elevated hemoglobin, but no history of elevated iron values and negative liver biopsy for evidence of iron deposition disease.    Elevated hemoglobin most likely is a reaction to obstructive sleep apnea which is untreated at this time.  There is no evidence of hemochromatosis at this time, but elevated ferritin in absence of elevated iron panel is likely reflective of inflammatory condition potentially involving liver as evidenced by elevation of AST and ALT with ALT predominance.  Hepatic synthetic or excretory functions did not appear to be affected at this point in time.  Plan: -Repeat lab work today as outlined below. -No phlebotomy at the moment, but will consider the therapy if indicated by the lab results. -Return to clinic in 1 week to review the findings.   Voice recognition software was used and creation of this note. Despite my best effort at editing the text, some misspelling/errors may have occurred. Orders Placed This Encounter  Procedures  . CBC with Differential (Cancer Center Only)    Standing Status:   Future    Number of Occurrences:   1    Standing Expiration Date:   04/21/2018  . CMP ([facility] only)    Standing Status:   Future    Number of Occurrences:   1    Standing Expiration Date:   04/21/2018  . Lactate dehydrogenase (LDH)    Standing Status:   Future    Number of Occurrences:   1    Standing Expiration Date:   04/20/2018  . Iron and TIBC    Standing Status:   Future    Number of Occurrences:   1    Standing Expiration Date:   04/21/2018  . Ferritin    Standing Status:   Future    Number of Occurrences:   1    Standing Expiration Date:   04/21/2018  . Erythropoietin    All questions were answered. . The patient knows to call the clinic with any problems, questions or  concerns.  This note was electronically signed.    History of Presenting Illness David Grant 47 y.o. presenting to the [facility] for elevated ferritin level referred by Dr. Colin Benton.  Patient's past medical history significant for obstructive sleep apnea, type 2 diabetes, intermittent abdominal pain and elevated ferritin.  Patient smokes about 0.3 packs/day and smoked up to 1.0 pack/day in the past.  Denies use of any over-the-counter or herbal medications.  He is active complaints include intermittent needlelike right lower abdominal/flank pain as well as mild discomfort in the epigastric region.  Patient denies nausea, vomiting, diarrhea, constipation, melena, or hematochezia.  Oncological/hematological History: --Liver Bx, 11/09/15: Moderate steatosis, NAS 5 and fibrosis, stage I. No evidence of iron deposition or alpha antitrypsin deposition. --Labs, 02/18/16:           tBili 0.6, AP ..., AST 50, ALT 89; Ferritin 745 --Labs, 10/05/16: WBC 6.7, hgb 16.9, Hct 49.7, Plt 148;   --Labs, 03/02/17: WBC 6.9, Hgb 17.6, Hct 49.5, Plt 171; tBili 0.5, AP ..., AST 53, ALT 78; Ferritin 902    Medical History: Past Medical History:  Diagnosis Date  . Abnormal blood level of iron 10/28/2015  . Diabetes mellitus without complication ([place])   . Hyperlipidemia   . OSA on CPAP   . Transaminitis 06/28/2015   -abnormal [place] -hx alcohol [place] -elevated  ferritin, but transferrin sat < 45, hemochromatosis genetic testing negative -referred to GI, advised to quit alcohol, advised healthy lifestyle and control of chronic disease - pt poorly compliant     Surgical History: Past Surgical History:  Procedure Laterality Date  . NO PAST SURGERIES      Family History: Family History  Problem Relation Age of Onset  . Bone cancer Father   . Diabetes Father   . Hypertension Father   . Colon cancer Neg Hx   . Colon polyps Neg Hx   . Kidney disease Neg Hx   . Esophageal cancer Neg Hx   . Heart disease Neg Hx      Social History: Social History   Socioeconomic History  . Marital status: Married    Spouse name: Not on file  . Number of children: 2  . Years of education: Not on file  . Highest education level: Not on file  Occupational History  . Occupation: Professor    Comment: [employer]  . Financial resource strain: Not on file  . Food insecurity:    Worry: Not on file    Inability: Not on file  . Transportation needs:    Medical: Not on file    Non-medical: Not on file  Tobacco Use  . Smoking status: Light Tobacco Smoker    Packs/day: 0.25    Years: 20.00    Pack years: 5.00    Types: Cigarettes  . Smokeless tobacco: Former Systems developer    Types: Chew  . Tobacco comment:  1-2 cig per week  Substance and Sexual Activity  . Alcohol use: Yes    Alcohol/week: 0.0 oz    Comment: rarely  . Drug use: No  . Sexual activity: Not on file  Lifestyle  . Physical activity:    Days per week: Not on file    Minutes per session: Not on file  . Stress: Not on file  Relationships  . Social connections:    Talks on phone: Not on file    Gets together: Not on file    Attends religious service: Not on file    Active member of club or organization: Not on file    Attends meetings of clubs or organizations: Not on file    Relationship status: Not on file  . Intimate partner violence:    Fear of current or ex partner: Not on file    Emotionally abused: Not on file    Physically abused: Not on file    Forced sexual activity: Not on file  Other Topics Concern  . Not on file  Social History Narrative   Work or School: Licensed conveyancer      Home Situation: lives with wife and child      Spiritual Beliefs: Christian      Lifestyle: no exercise, diet ok             Allergies: Allergies  Allergen Reactions  . Shellfish Allergy     Medications:  Current Outpatient Medications  Medication Sig Dispense Refill  . ACCU-CHEK FASTCLIX LANCETS MISC  Test twice daily. 102 each 3  . aspirin 81 MG tablet Take 81 mg by mouth daily.    . cholecalciferol (VITAMIN D) 400 units TABS tablet Take 400 Units by mouth.    . Coenzyme Q10 (CO Q 10) 100 MG CAPS Take 100 mg by mouth.    Marland Kitchen glipiZIDE (GLUCOTROL) 10 MG tablet Take 1 tablet (10 mg total)  by mouth 2 (two) times daily before a meal. 14 tablet 0  . glipiZIDE (GLUCOTROL) 10 MG tablet TAKE 1 TABLET TWICE A DAY  BEFORE MEALS 180 tablet 1  . glucose blood (ACCU-CHEK SMARTVIEW) test strip Use as directed to check blood sugar. 100 each 3  . JANUVIA 100 MG tablet TAKE 1 TABLET DAILY 90 tablet 1  . Lancets (ONETOUCH ULTRASOFT) lancets Use as instructed 100 each 12  . lisinopril (PRINIVIL,ZESTRIL) 2.5 MG tablet TAKE 1 TABLET DAILY 90 tablet 1  . metFORMIN (GLUCOPHAGE) 1000 MG tablet Take 1 tablet (1,000 mg total) by mouth 2 (two) times daily with a meal. 14 tablet 0  . metFORMIN (GLUCOPHAGE) 1000 MG tablet TAKE 1 TABLET TWICE A DAY  WITH MEALS 180 tablet 1  . Omega-3 Fatty Acids (FISH OIL) 1200 MG CPDR Take 1,200 mg by mouth.    . pioglitazone (ACTOS) 15 MG tablet Take 1 tablet once daily 90 tablet 1  . pravastatin (PRAVACHOL) 40 MG tablet Take 1 tablet (40 mg total) by mouth daily. 90 tablet 1   No current facility-administered medications for this visit.     Review of Systems: Review of Systems  Gastrointestinal: Positive for abdominal pain.  All other systems reviewed and are negative.    PHYSICAL EXAMINATION Blood pressure (!) 121/92, pulse (!) 106, temperature 98.6 F (37 C), temperature source Oral, resp. rate 18, height 6' (1.829 m), weight 221 lb 8 oz (100.5 kg), SpO2 97 %.  ECOG PERFORMANCE STATUS: 1 - Symptomatic but completely ambulatory  Physical Exam  Constitutional: He is oriented to person, place, and time and well-developed, well-nourished, and in no distress. No distress.  HENT:  Head: Normocephalic and atraumatic.  Mouth/Throat: Oropharynx is clear and moist. No  oropharyngeal exudate.  Eyes: Pupils are equal, round, and reactive to light. Conjunctivae and EOM are normal. No scleral icterus.  Neck: No thyromegaly present.  Cardiovascular: Normal rate, regular rhythm, normal heart sounds and intact distal pulses.  No murmur heard. Pulmonary/Chest: Effort normal and breath sounds normal. No respiratory distress. He has no wheezes. He has no rales.  Abdominal: Soft. Bowel sounds are normal. He exhibits no distension and no mass. There is no tenderness. There is no guarding.  Musculoskeletal: He exhibits no edema.  Lymphadenopathy:    He has no cervical adenopathy.  Neurological: He is alert and oriented to person, place, and time. He has normal reflexes. No cranial nerve deficit.  Skin: Skin is warm and dry. No rash noted. He is not diaphoretic. No erythema. No pallor.     LABORATORY DATA: I have personally reviewed the data as listed: Appointment on 04/20/2017  Component Date Value Ref Range Status  . Ferritin 04/20/2017 679* 22 - 316 ng/mL Final   Performed at [facility], [place] [address] [place], [place] [address]  . Iron 04/20/2017 130  42 - 163 ug/dL Final  . TIBC 04/20/2017 341  202 - 409 ug/dL Final  . Saturation Ratios 04/20/2017 38* 42 - 163 % Final  . UIBC 04/20/2017 211  ug/dL Final   Performed at [facility], [place] [address], [place], [place] [address]  . LDH 04/20/2017 168  125 - 245 U/L Final   Performed at [facility], [place] [address], [place], [place] [address]  . Sodium 04/20/2017 136  136 - 145 mmol/L Final  . Potassium 04/20/2017 4.4  3.5 - 5.1 mmol/L Final  . Chloride 04/20/2017 102  98 -  109 mmol/L Final  . CO2 04/20/2017 26  22 - 29 mmol/L Final  . Glucose, Bld 04/20/2017 227* 70 - 140 mg/dL Final  . BUN 04/20/2017 16  7 - 26 mg/dL Final  . Creatinine 04/20/2017 1.15  0.70 - 1.30 mg/dL Final  . Calcium 04/20/2017 10.2  8.4 - 10.4 mg/dL Final  .  Total Protein 04/20/2017 7.7  6.4 - 8.3 g/dL Final  . Albumin 04/20/2017 4.3  3.5 - 5.0 g/dL Final  . AST 04/20/2017 57* 5 - 34 U/L Final  . ALT 04/20/2017 85* 0 - 55 U/L Final  . Alkaline Phosphatase 04/20/2017 68  40 - 150 U/L Final  . Total Bilirubin 04/20/2017 0.7  0.2 - 1.2 mg/dL Final  . GFR, Est Non Af Am 04/20/2017 >60  >60 mL/min Final  . GFR, Est AFR Am 04/20/2017 >60  >60 mL/min Final   Comment: (NOTE) The eGFR has been calculated using the CKD EPI equation. This calculation has not been validated in all clinical situations. eGFR's persistently <60 mL/min signify possible Chronic Kidney Disease.   Georgiann Hahn gap 04/20/2017 8  3 - 11 Final   Performed at [facility], [place] [address] [place], [place] [address]  . WBC Count 04/20/2017 6.5  4.0 - 10.3 K/uL Final  . RBC 04/20/2017 5.74  4.20 - 5.82 MIL/uL Final  . Hemoglobin 04/20/2017 17.4* 13.0 - 17.1 g/dL Final  . HCT 04/20/2017 50.0* 38.4 - 49.9 % Final  . MCV 04/20/2017 87.1  79.3 - 98.0 fL Final  . MCH 04/20/2017 30.3  27.2 - 33.4 pg Final  . MCHC 04/20/2017 34.8  32.0 - 36.0 g/dL Final  . RDW 04/20/2017 13.2  11.0 - 14.6 % Final  . Platelet Count 04/20/2017 128* 140 - 400 K/uL Final  . Neutrophils Relative % 04/20/2017 54  % Final  . Neutro Abs 04/20/2017 3.5  1.5 - 6.5 K/uL Final  . Lymphocytes Relative 04/20/2017 36  % Final  . Lymphs Abs 04/20/2017 2.4  0.9 - 3.3 K/uL Final  . Monocytes Relative 04/20/2017 8  % Final  . Monocytes Absolute 04/20/2017 0.5  0.1 - 0.9 K/uL Final  . Eosinophils Relative 04/20/2017 2  % Final  . Eosinophils Absolute 04/20/2017 0.2  0.0 - 0.5 K/uL Final  . Basophils Relative 04/20/2017 0  % Final  . Basophils Absolute 04/20/2017 0.0  0.0 - 0.1 K/uL Final   Performed at [facility], [place] [address] [place], [place] [address]  Office Visit on 04/20/2017  Component Date Value Ref Range Status  . Erythropoietin 04/20/2017 6.3  2.6 - 18.5  mIU/mL Final   Comment: (NOTE) Beckman Coulter UniCel DxI [place]ain obtained with different assay methods or kits cannot be used interchangeably. Results cannot be interpreted as absolute evidence of the presence or absence of malignant disease. Performed At: [facility] [place], [place] [id number] Rush Farmer MD CB:[id number] Performed at [facility], [place] [address] [place],  [address]          Ardath Sax, MD

## 2017-04-29 NOTE — Assessment & Plan Note (Signed)
47 y.o. with elevated transaminases and significantly elevated ferritin level in the context of elevated hemoglobin, but no history of elevated iron values and negative liver biopsy for evidence of iron deposition disease.    Elevated hemoglobin most likely is a reaction to obstructive sleep apnea which is untreated at this time.  There is no evidence of hemochromatosis at this time, but elevated ferritin in absence of elevated iron panel is likely reflective of inflammatory condition potentially involving liver as evidenced by elevation of AST and ALT with ALT predominance.  Hepatic synthetic or excretory functions did not appear to be affected at this point in time.  Plan: -Repeat lab work today as outlined below. -No phlebotomy at the moment, but will consider the therapy if indicated by the lab results. -Return to clinic in 1 week to review the findings.

## 2017-05-03 ENCOUNTER — Encounter: Payer: Self-pay | Admitting: Hematology and Oncology

## 2017-05-03 NOTE — Assessment & Plan Note (Signed)
47 y.o. with elevated transaminases and significantly elevated ferritin level in the context of elevated hemoglobin, but no history of elevated iron values and negative liver biopsy for evidence of iron deposition disease.    Elevated hemoglobin most likely is a reaction to obstructive sleep apnea which is untreated at this time.  This is supported by normal value of her erythropoietin which essentially rules out primary hematological abnormality such as myeloproliferative neoplasm.  There is no evidence of hemochromatosis at this time, but elevated ferritin in absence of elevated iron panel is likely reflective of inflammatory condition potentially involving liver as evidenced by elevation of AST and ALT with ALT predominance.  Hepatic synthetic or excretory functions did not appear to be affected at this point in time.  Plan: -Recommend additional evaluation by gastroenterology/pathology as I believe that the reference lab abnormalities related to ongoing liver injury. - Return to our clinic as needed.

## 2017-05-03 NOTE — Progress Notes (Signed)
[facility] Follow-up Visit:  Assessment: Elevated ferritin level 47 y.o. with elevated transaminases and significantly elevated ferritin level in the context of elevated hemoglobin, but no history of elevated iron values and negative liver biopsy for evidence of iron deposition disease.    Elevated hemoglobin most likely is a reaction to obstructive sleep apnea which is untreated at this time.  This is supported by normal value of her erythropoietin which essentially rules out primary hematological abnormality such as myeloproliferative neoplasm.  There is no evidence of hemochromatosis at this time, but elevated ferritin in absence of elevated iron panel is likely reflective of inflammatory condition potentially involving liver as evidenced by elevation of AST and ALT with ALT predominance.  Hepatic synthetic or excretory functions did not appear to be affected at this point in time.  Plan: -Recommend additional evaluation by gastroenterology/pathology as I believe that the reference lab abnormalities related to ongoing liver injury. - Return to our clinic as needed.   Voice recognition software was used and creation of this note. Despite my best effort at editing the text, some misspelling/errors may have occurred.  No orders of the defined types were placed in this encounter.   Cancer Staging No matching staging information was found for the patient.  All questions were answered. . The patient knows to call the clinic with any problems, questions or concerns.  This note was electronically signed.    History of Presenting Illness David Grant is a 47 y.o. [name] male followed in the [facility] for elevated ferritin level referred by Dr. Colin Benton.  Patient's past medical history significant for obstructive sleep apnea, type 2 diabetes, intermittent abdominal pain and elevated ferritin.  Patient smokes about 0.3 packs/day and smoked up to 1.0 pack/day in the  past.  Denies use of any over-the-counter or herbal medications.  He is active complaints include intermittent needlelike right lower abdominal/flank pain as well as mild discomfort in the epigastric region.  Patient denies nausea, vomiting, diarrhea, constipation, melena, or hematochezia.  Oncological/hematological History: --Liver Bx, 11/09/15: Moderate steatosis, NAS 5 and fibrosis, stage I. No evidence of iron deposition or alpha antitrypsin deposition. --Labs, 02/18/16:                                                               tBili 0.6, AP  ..., AST 50, ALT 89;       Ferritin 745 --Labs, 10/05/16: WBC 6.7, hgb 16.9, Hct 49.7, Plt 148;   --Labs, 03/02/17: WBC 6.9, Hgb 17.6, Hct 49.5, Plt 171; tBili 0.5, AP  ..., AST 53, ALT 78;       Ferritin 902 --Labs, 04/20/17: WBC 6.5, Hgb 17.4, Hct 50.0, Plt 128; tBili 0.7, AP 68, AST 57, ALT 85; Fe 130, FeSat 38%, TIBC 341,  Ferritin 679; Epo 6.3    No history exists.    Medical History: Past Medical History:  Diagnosis Date  . Abnormal blood level of iron 10/28/2015  . Diabetes mellitus without complication ([facility])   . Hyperlipidemia   . OSA on CPAP   . Transaminitis 06/28/2015   -abnormal [place] -hx alcohol [place] -elevated ferritin, but transferrin sat < 45, hemochromatosis genetic testing negative -referred to GI, advised to quit alcohol, advised healthy lifestyle and control of chronic  disease - pt poorly compliant     Surgical History: Past Surgical History:  Procedure Laterality Date  . NO PAST SURGERIES      Family History: Family History  Problem Relation Age of Onset  . Bone cancer Father   . Diabetes Father   . Hypertension Father   . Colon cancer Neg Hx   . Colon polyps Neg Hx   . Kidney disease Neg Hx   . Esophageal cancer Neg Hx   . Heart disease Neg Hx     Social History: Social History   Socioeconomic History  . Marital status: Married    Spouse name: Not on file  . Number of children: 2  . Years of education:  Not on file  . Highest education level: Not on file  Occupational History  . Occupation: Professor    Comment: [employer]  . Financial resource strain: Not on file  . Food insecurity:    Worry: Not on file    Inability: Not on file  . Transportation needs:    Medical: Not on file    Non-medical: Not on file  Tobacco Use  . Smoking status: Light Tobacco Smoker    Packs/day: 0.25    Years: 20.00    Pack years: 5.00    Types: Cigarettes  . Smokeless tobacco: Former Systems developer    Types: Chew  . Tobacco comment:  1-2 cig per week  Substance and Sexual Activity  . Alcohol use: Yes    Alcohol/week: 0.0 oz    Comment: rarely  . Drug use: No  . Sexual activity: Not on file  Lifestyle  . Physical activity:    Days per week: Not on file    Minutes per session: Not on file  . Stress: Not on file  Relationships  . Social connections:    Talks on phone: Not on file    Gets together: Not on file    Attends religious service: Not on file    Active member of club or organization: Not on file    Attends meetings of clubs or organizations: Not on file    Relationship status: Not on file  . Intimate partner violence:    Fear of current or ex partner: Not on file    Emotionally abused: Not on file    Physically abused: Not on file    Forced sexual activity: Not on file  Other Topics Concern  . Not on file  Social History Narrative   Work or School: Licensed conveyancer      Home Situation: lives with wife and child      Spiritual Beliefs: Christian      Lifestyle: no exercise, diet ok             Allergies: Allergies  Allergen Reactions  . Shellfish Allergy     Medications:  Current Outpatient Medications  Medication Sig Dispense Refill  . ACCU-CHEK FASTCLIX LANCETS MISC Test twice daily. 102 each 3  . aspirin 81 MG tablet Take 81 mg by mouth daily.    . cholecalciferol (VITAMIN D) 400 units TABS tablet Take 400 Units by mouth.    .  Coenzyme Q10 (CO Q 10) 100 MG CAPS Take 100 mg by mouth.    Marland Kitchen glipiZIDE (GLUCOTROL) 10 MG tablet Take 1 tablet (10 mg total) by mouth 2 (two) times daily before a meal. 14 tablet 0  . glipiZIDE (GLUCOTROL) 10 MG tablet TAKE 1 TABLET TWICE A  DAY  BEFORE MEALS 180 tablet 1  . glucose blood (ACCU-CHEK SMARTVIEW) test strip Use as directed to check blood sugar. 100 each 3  . JANUVIA 100 MG tablet TAKE 1 TABLET DAILY 90 tablet 1  . Lancets (ONETOUCH ULTRASOFT) lancets Use as instructed 100 each 12  . lisinopril (PRINIVIL,ZESTRIL) 2.5 MG tablet TAKE 1 TABLET DAILY 90 tablet 1  . metFORMIN (GLUCOPHAGE) 1000 MG tablet Take 1 tablet (1,000 mg total) by mouth 2 (two) times daily with a meal. 14 tablet 0  . metFORMIN (GLUCOPHAGE) 1000 MG tablet TAKE 1 TABLET TWICE A DAY  WITH MEALS 180 tablet 1  . Omega-3 Fatty Acids (FISH OIL) 1200 MG CPDR Take 1,200 mg by mouth.    . pioglitazone (ACTOS) 15 MG tablet Take 1 tablet once daily 90 tablet 1  . pravastatin (PRAVACHOL) 40 MG tablet Take 1 tablet (40 mg total) by mouth daily. 90 tablet 1   No current facility-administered medications for this visit.     Review of Systems: Review of Systems  Gastrointestinal: Positive for abdominal pain.  All other systems reviewed and are negative.    PHYSICAL EXAMINATION There were no vitals taken for this visit.  ECOG PERFORMANCE STATUS: 1 - Symptomatic but completely ambulatory  Physical Exam  Constitutional: He is oriented to person, place, and time and well-developed, well-nourished, and in no distress. No distress.  HENT:  Head: Normocephalic and atraumatic.  Mouth/Throat: Oropharynx is clear and moist. No oropharyngeal exudate.  Eyes: Pupils are equal, round, and reactive to light. Conjunctivae and EOM are normal. No scleral icterus.  Neck: No thyromegaly present.  Cardiovascular: Normal rate, regular rhythm, normal heart sounds and intact distal pulses.  No murmur heard. Pulmonary/Chest: Effort normal  and breath sounds normal. No respiratory distress. He has no wheezes. He has no rales.  Abdominal: Soft. Bowel sounds are normal. He exhibits no distension and no mass. There is no tenderness. There is no guarding.  Musculoskeletal: He exhibits no edema.  Lymphadenopathy:    He has no cervical adenopathy.  Neurological: He is alert and oriented to person, place, and time. He has normal reflexes. No cranial nerve deficit.  Skin: Skin is warm and dry. No rash noted. He is not diaphoretic. No erythema. No pallor.     LABORATORY DATA: I have personally reviewed the data as listed: No visits with results within 1 Week(s) from this visit.  Latest known visit with results is:  Appointment on 04/20/2017  Component Date Value Ref Range Status  . Ferritin 04/20/2017 679* 22 - 316 ng/mL Final   Performed at [facility], [place] [address], [place], [place] [address]  . Iron 04/20/2017 130  42 - 163 ug/dL Final  . TIBC 04/20/2017 341  202 - 409 ug/dL Final  . Saturation Ratios 04/20/2017 38* 42 - 163 % Final  . UIBC 04/20/2017 211  ug/dL Final   Performed at [facility], [place] [address] [place], [place] [address]  . LDH 04/20/2017 168  125 - 245 U/L Final   Performed at [facility], [place] [address], [place], [place] [address]  . Sodium 04/20/2017 136  136 - 145 mmol/L Final  . Potassium 04/20/2017 4.4  3.5 - 5.1 mmol/L Final  . Chloride 04/20/2017 102  98 - 109 mmol/L Final  . CO2 04/20/2017 26  22 - 29 mmol/L Final  . Glucose, Bld 04/20/2017 227* 70 - 140 mg/dL Final  . BUN 04/20/2017  16  7 - 26 mg/dL Final  . Creatinine 04/20/2017 1.15  0.70 - 1.30 mg/dL Final  . Calcium 04/20/2017 10.2  8.4 - 10.4 mg/dL Final  . Total Protein 04/20/2017 7.7  6.4 - 8.3 g/dL Final  . Albumin 04/20/2017 4.3  3.5 - 5.0 g/dL Final  . AST 04/20/2017 57* 5 - 34 U/L Final  . ALT 04/20/2017 85* 0 - 55 U/L Final  . Alkaline Phosphatase  04/20/2017 68  40 - 150 U/L Final  . Total Bilirubin 04/20/2017 0.7  0.2 - 1.2 mg/dL Final  . GFR, Est Non Af Am 04/20/2017 >60  >60 mL/min Final  . GFR, Est AFR Am 04/20/2017 >60  >60 mL/min Final   Comment: (NOTE) The eGFR has been calculated using the CKD EPI equation. This calculation has not been validated in all clinical situations. eGFR's persistently <60 mL/min signify possible Chronic Kidney Disease.   Georgiann Hahn gap 04/20/2017 8  3 - 11 Final   Performed at [facility], [place] [address], [place], [place] [address]  . WBC Count 04/20/2017 6.5  4.0 - 10.3 K/uL Final  . RBC 04/20/2017 5.74  4.20 - 5.82 MIL/uL Final  . Hemoglobin 04/20/2017 17.4* 13.0 - 17.1 g/dL Final  . HCT 04/20/2017 50.0* 38.4 - 49.9 % Final  . MCV 04/20/2017 87.1  79.3 - 98.0 fL Final  . MCH 04/20/2017 30.3  27.2 - 33.4 pg Final  . MCHC 04/20/2017 34.8  32.0 - 36.0 g/dL Final  . RDW 04/20/2017 13.2  11.0 - 14.6 % Final  . Platelet Count 04/20/2017 128* 140 - 400 K/uL Final  . Neutrophils Relative % 04/20/2017 54  % Final  . Neutro Abs 04/20/2017 3.5  1.5 - 6.5 K/uL Final  . Lymphocytes Relative 04/20/2017 36  % Final  . Lymphs Abs 04/20/2017 2.4  0.9 - 3.3 K/uL Final  . Monocytes Relative 04/20/2017 8  % Final  . Monocytes Absolute 04/20/2017 0.5  0.1 - 0.9 K/uL Final  . Eosinophils Relative 04/20/2017 2  % Final  . Eosinophils Absolute 04/20/2017 0.2  0.0 - 0.5 K/uL Final  . Basophils Relative 04/20/2017 0  % Final  . Basophils Absolute 04/20/2017 0.0  0.0 - 0.1 K/uL Final   Performed at [facility], [place] [address], [place], [place] [address]       Ardath Sax, MD

## 2017-06-26 ENCOUNTER — Ambulatory Visit: Payer: BC Managed Care – PPO | Admitting: Family Medicine

## 2017-06-28 LAB — HM DIABETES EYE EXAM

## 2017-07-05 ENCOUNTER — Ambulatory Visit: Payer: BC Managed Care – PPO | Admitting: Family Medicine

## 2017-07-05 ENCOUNTER — Encounter: Payer: Self-pay | Admitting: Family Medicine

## 2017-07-05 ENCOUNTER — Telehealth: Payer: Self-pay | Admitting: *Deleted

## 2017-07-05 VITALS — BP 104/72 | HR 89 | Temp 98.2°F | Ht 72.0 in | Wt 225.7 lb

## 2017-07-05 DIAGNOSIS — R7401 Elevation of levels of liver transaminase levels: Secondary | ICD-10-CM

## 2017-07-05 DIAGNOSIS — E785 Hyperlipidemia, unspecified: Secondary | ICD-10-CM | POA: Diagnosis not present

## 2017-07-05 DIAGNOSIS — E1169 Type 2 diabetes mellitus with other specified complication: Secondary | ICD-10-CM

## 2017-07-05 DIAGNOSIS — R7989 Other specified abnormal findings of blood chemistry: Secondary | ICD-10-CM

## 2017-07-05 DIAGNOSIS — E1165 Type 2 diabetes mellitus with hyperglycemia: Secondary | ICD-10-CM | POA: Diagnosis not present

## 2017-07-05 DIAGNOSIS — E1129 Type 2 diabetes mellitus with other diabetic kidney complication: Secondary | ICD-10-CM

## 2017-07-05 DIAGNOSIS — IMO0002 Reserved for concepts with insufficient information to code with codable children: Secondary | ICD-10-CM

## 2017-07-05 DIAGNOSIS — R740 Nonspecific elevation of levels of transaminase and lactic acid dehydrogenase [LDH]: Secondary | ICD-10-CM | POA: Diagnosis not present

## 2017-07-05 MED ORDER — PIOGLITAZONE HCL 15 MG PO TABS: ORAL_TABLET | ORAL | 1 refills | Status: AC

## 2017-07-05 NOTE — Progress Notes (Signed)
HPI:  Using dictation device. Unfortunately this device frequently misinterprets words/phrases.  David Grant is a pleasant 47 y.o. here for follow up. Chronic medical problems summarized below were reviewed for changes. At his last visit his diabetes was not controlled and we increased his actos and referred for diabetes education. We again advised hematology evaluation for his very elevated ferritin. He did see hematology and they feel his issues are liver related and advise he have reevaluation with GI. We advised he quit drinking, quit smoking and eat healthy.  Reports trying to eat healthier and trying to exercise. Continues to smoke. Reports no alcohol use recently. He is upset about the bill from the hematology evaluation. He declines to see GI or hepatology about his liver. Denies CP, SOB, DOE, treatment intolerance or new symptoms. Reports is using CPAP nightly and feels sleep and rest/energy is good.  Eye exam - reports done, does not know the name of the doctor  Diabetes mellitus with kidney complications: -History of poor compliance -Medications include:aspirin, glipizide, Januvia, metformin and ACE inhibitor -endocrinologist added pioglitazone   Hypertension in the setting of diabetes: -Medications include: Lisinopril  Hyperlipidemia in the setting of diabetes: -Medications include: fish oil, pravastatin  Obstructive sleep apnea: -Uses CPAP  History of smoking and alcohol use  Elevated blood iron/transaminitis: -Status post evaluation with gastroenterology -Genetic testing for hemachromatosis was negative -He had an ultrasound of the liver and biopsy that showed no iron deposition, fatty liver and some scarring/fibrosis, he was frustrated with the cost of this evaluation and declined further GI follow-up -Referred to hematology for management of his persistently elevated ferritin and his concerns about when he may need phlebotomy, but he opted not to go or pursue  further evaluation or treatment for some time, did see hematology 2019 and felt likely related to ongoing liver injury and further GI specialist evaluation advised   ROS: See pertinent positives and negatives per HPI.  Past Medical History:  Diagnosis Date  . Abnormal blood level of iron 10/28/2015  . Diabetes mellitus without complication ([facility])   . Hyperlipidemia   . OSA on CPAP   . Transaminitis 06/28/2015   -abnormal [place] -hx alcohol [place] -elevated ferritin, but transferrin sat < 45, hemochromatosis genetic testing negative -referred to GI, advised to quit alcohol, advised healthy lifestyle and control of chronic disease - pt poorly compliant     Past Surgical History:  Procedure Laterality Date  . NO PAST SURGERIES      Family History  Problem Relation Age of Onset  . Bone cancer Father   . Diabetes Father   . Hypertension Father   . Colon cancer Neg Hx   . Colon polyps Neg Hx   . Kidney disease Neg Hx   . Esophageal cancer Neg Hx   . Heart disease Neg Hx     SOCIAL HX: see hpi   Current Outpatient Medications:  .  ACCU-CHEK FASTCLIX LANCETS MISC, Test twice daily., Disp: 102 each, Rfl: 3 .  aspirin 81 MG tablet, Take 81 mg by mouth daily., Disp: , Rfl:  .  cholecalciferol (VITAMIN D) 400 units TABS tablet, Take 400 Units by mouth., Disp: , Rfl:  .  Coenzyme Q10 (CO Q 10) 100 MG CAPS, Take 100 mg by mouth., Disp: , Rfl:  .  glipiZIDE (GLUCOTROL) 10 MG tablet, TAKE 1 TABLET TWICE A DAY  BEFORE MEALS, Disp: 180 tablet, Rfl: 1 .  glucose blood (ACCU-CHEK SMARTVIEW) test strip, Use as directed to check blood  sugar., Disp: 100 each, Rfl: 3 .  JANUVIA 100 MG tablet, TAKE 1 TABLET DAILY, Disp: 90 tablet, Rfl: 1 .  lisinopril (PRINIVIL,ZESTRIL) 2.5 MG tablet, TAKE 1 TABLET DAILY, Disp: 90 tablet, Rfl: 1 .  metFORMIN (GLUCOPHAGE) 1000 MG tablet, TAKE 1 TABLET TWICE A DAY  WITH MEALS, Disp: 180 tablet, Rfl: 1 .  Omega-3 Fatty Acids (FISH OIL) 1200 MG CPDR, Take 1,200 mg by mouth.,  Disp: , Rfl:  .  pravastatin (PRAVACHOL) 40 MG tablet, Take 1 tablet (40 mg total) by mouth daily., Disp: 90 tablet, Rfl: 1 .  pioglitazone (ACTOS) 15 MG tablet, Take 1 tablet once daily, Disp: 90 tablet, Rfl: 1  EXAM:  Vitals:   07/05/17 1603  BP: 104/72  Pulse: 89  Temp: 98.2 F (36.8 C)    Body mass index is 30.61 kg/m.  GENERAL: vitals reviewed and listed above, alert, oriented, appears well hydrated and in no acute distress  HEENT: atraumatic, conjunttiva clear, no obvious abnormalities on inspection of external nose and ears  NECK: no obvious masses on inspection  LUNGS: clear to auscultation bilaterally, no wheezes, rales or rhonchi, good air movement  CV: HRRR, no peripheral edema  MS: moves all extremities without noticeable abnormality  PSYCH: pleasant and cooperative, no obvious depression or anxiety  ASSESSMENT AND PLAN:  Discussed the following assessment and plan:  Type 2 diabetes, uncontrolled, with renal manifestation (HCC)  Hyperlipidemia associated with type 2 diabetes mellitus ([facility])  Transaminitis  Elevated ferritin  -advised to see hepatology/GI but he declined at this time, advised in the interim no alcohol, quit smoking, healthy diet and regular exercise -due for hgba1c check -reports did eye exam last week and told good - advised assistant to obtain report and update, but he did not remember name of doctor and he agreed to let us know -follow up in 3-4 months, sooner as needed  Patient Instructions  BEFORE YOU LEAVE: -labs -follow up: 3-4 months -name of eye doctor  Please let us know if you change your mind and you want a referral to a liver specialist.  Please eat a healthy diet. Mediterranean would be good. (see below)  Get regular aerobic exercise.  No alcohol.  Quit smoking.   We recommend the following healthy lifestyle for LIFE: 1) Small portions. But, make sure to get regular (at least 3 per day), healthy meals and small  healthy snacks if needed.  2) Eat a healthy clean diet.   TRY TO EAT: -at least 5-7 servings of low sugar, colorful, and nutrient rich vegetables per day (not corn, potatoes) -berries are the best choice if you wish to eat fruit (only eat small amounts if trying to reduce weight or blood sugar)  -lean meets (fish, white meat of chicken or [place]) -vegan proteins for some meals - beans or tofu, whole grains, nuts and seeds -Replace bad fats with good fats - good fats include: fish, nuts and seeds, canola oil, olive oil -small amounts of low fat or non fat dairy -small amounts of100 % whole grains - check the lables (no white rice, white bread, regular pasta, etc) -drink plenty of water  AVOID: -SUGAR, sweets, anything with added sugar, corn syrup or sweeteners - must read labels as even foods advertised as "healthy" often are loaded with sugar -if you must have a sweetener, small amounts of stevia may be best -sweetened beverages and artificially sweetened beverages -simple starches (rice, bread, potatoes, pasta, chips, etc - small amounts of 100% whole  grains are ok) -red meat, pork, butter -fried foods, fast food, processed food, excessive dairy, eggs and coconut.  3)Get at least 150 minutes of sweaty aerobic exercise per week.  4)Reduce stress - consider counseling, meditation and relaxation to balance other aspects of your life.     Terressa Koyanagi, DO

## 2017-07-05 NOTE — Patient Instructions (Addendum)
BEFORE YOU LEAVE: -labs -follow up: 3-4 months -name of eye doctor  Please let us know if you change your mind and you want a referral to a liver specialist.  Please eat a healthy diet. Mediterranean would be good. (see below)  Get regular aerobic exercise.  No alcohol.  Quit smoking.   We recommend the following healthy lifestyle for LIFE: 1) Small portions. But, make sure to get regular (at least 3 per day), healthy meals and small healthy snacks if needed.  2) Eat a healthy clean diet.   TRY TO EAT: -at least 5-7 servings of low sugar, colorful, and nutrient rich vegetables per day (not corn, potatoes) -berries are the best choice if you wish to eat fruit (only eat small amounts if trying to reduce weight or blood sugar)  -lean meets (fish, white meat of chicken or [place]) -vegan proteins for some meals - beans or tofu, whole grains, nuts and seeds -Replace bad fats with good fats - good fats include: fish, nuts and seeds, canola oil, olive oil -small amounts of low fat or non fat dairy -small amounts of100 % whole grains - check the lables (no white rice, white bread, regular pasta, etc) -drink plenty of water  AVOID: -SUGAR, sweets, anything with added sugar, corn syrup or sweeteners - must read labels as even foods advertised as "healthy" often are loaded with sugar -if you must have a sweetener, small amounts of stevia may be best -sweetened beverages and artificially sweetened beverages -simple starches (rice, bread, potatoes, pasta, chips, etc - small amounts of 100% whole grains are ok) -red meat, pork, butter -fried foods, fast food, processed food, excessive dairy, eggs and coconut.  3)Get at least 150 minutes of sweaty aerobic exercise per week.  4)Reduce stress - consider counseling, meditation and relaxation to balance other aspects of your life.

## 2017-07-05 NOTE — Telephone Encounter (Signed)
Patient was seen today and stated the Rx he has for Actos states to take 1/2 pill a day vs 1 tablet a day.  I called [facility] at [phone] and spoke with Cescilie and informed her the Rx from 03/05/2017 indicates the pt should be taking one tablet daily and not 1/2.  She stated they do have this Rx and the last Rx was filled via automatic refill and sent to the pt on 4/6 to take 1/2.  Refill sent to [facility] with a note stating to please note sigs and disregard the previous Rx and the pt was informed of this.

## 2017-07-06 LAB — HEMOGLOBIN A1C: Hgb A1c MFr Bld: 8 % — ABNORMAL HIGH (ref 4.6–6.5)

## 2017-07-17 ENCOUNTER — Telehealth: Payer: Self-pay | Admitting: Family Medicine

## 2017-07-17 ENCOUNTER — Telehealth: Payer: Self-pay | Admitting: *Deleted

## 2017-07-17 NOTE — Telephone Encounter (Signed)
Result note read in telephone encounter

## 2017-07-17 NOTE — Telephone Encounter (Signed)
Result note read to patient (07/05/17 A1C) Pt verbalizes understanding. States would like to have rechecked in 3-4 months then decide on Endocrinology referral.  Result note was not routed to [facility]; telephone encounter.

## 2017-07-17 NOTE — Telephone Encounter (Signed)
Copied from CRM [id number]. Topic: Quick Communication - Lab Results >> Jul 09, 2017  1:46 PM Johnella MoloneyFunderburk, Jo A, CMA wrote: Called patient to inform them of lab results. When patient returns call, triage nurse may disclose results.  Pt is calling to get lab results. Would like a call back after 12pm.

## 2017-09-10 IMAGING — US US BIOPSY
1 series · 4 of 4 positions shown · non-contrast
Comparison: none

INDICATION: Abnormal liver function tests.  Elevated iron level.

[Series 1: us biopsy · 0.23mm/px · 4 of 4 slices shown]
[im 1/4]
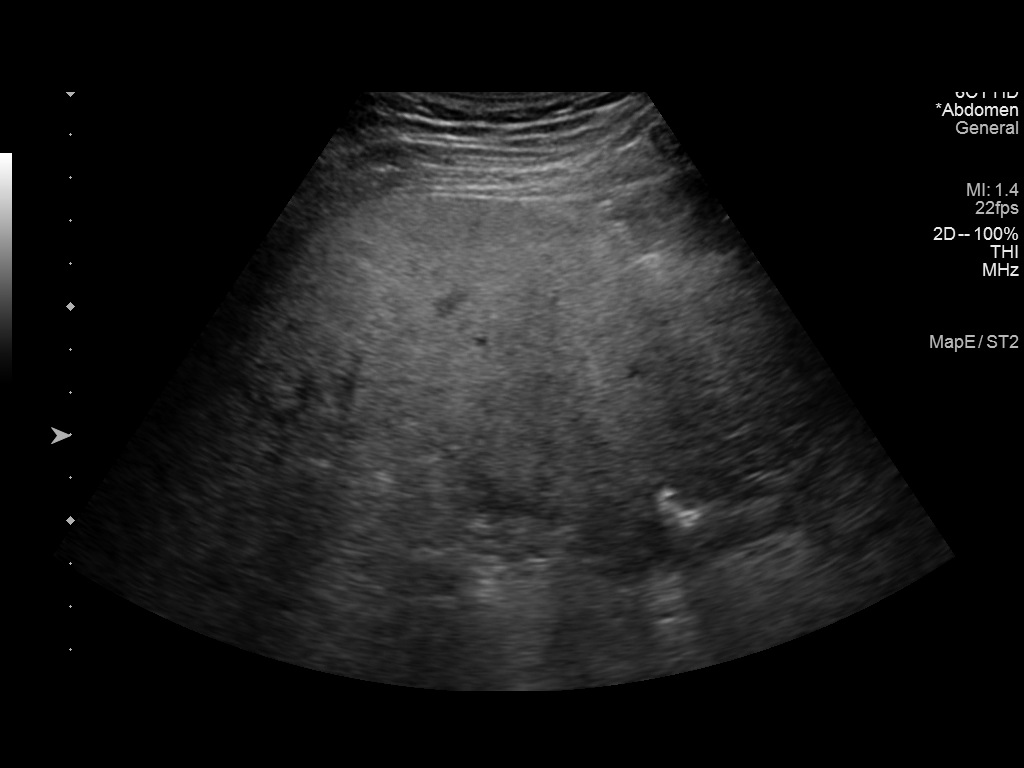
[im 2/4]
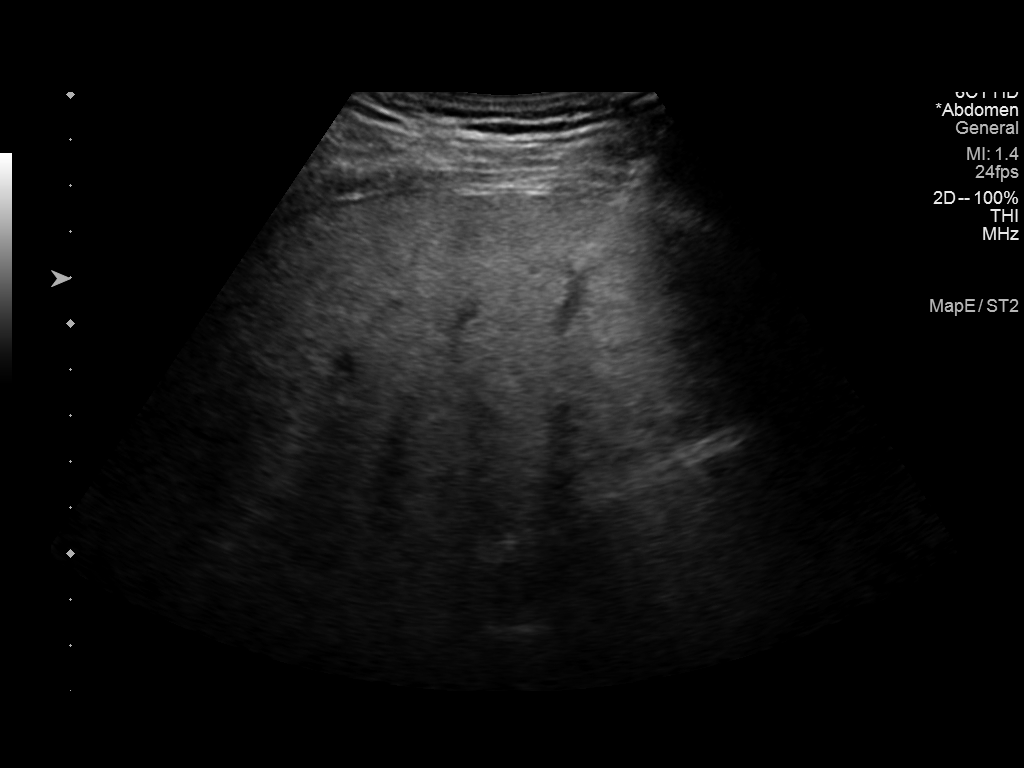
[im 3/4]
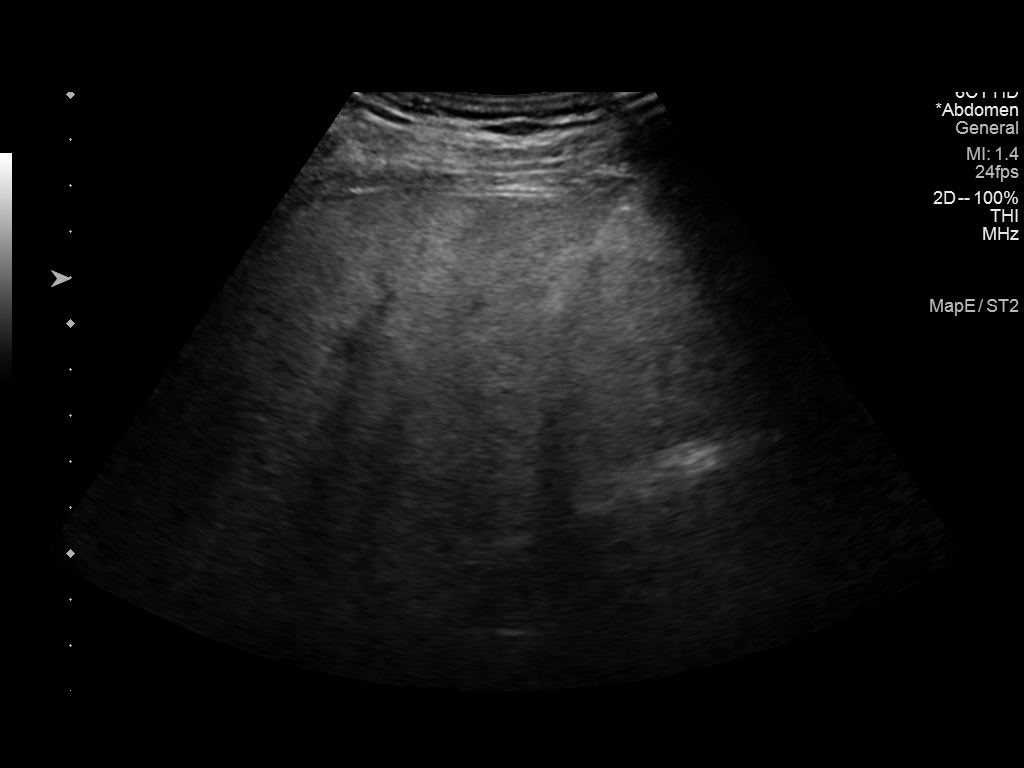
[im 4/4]
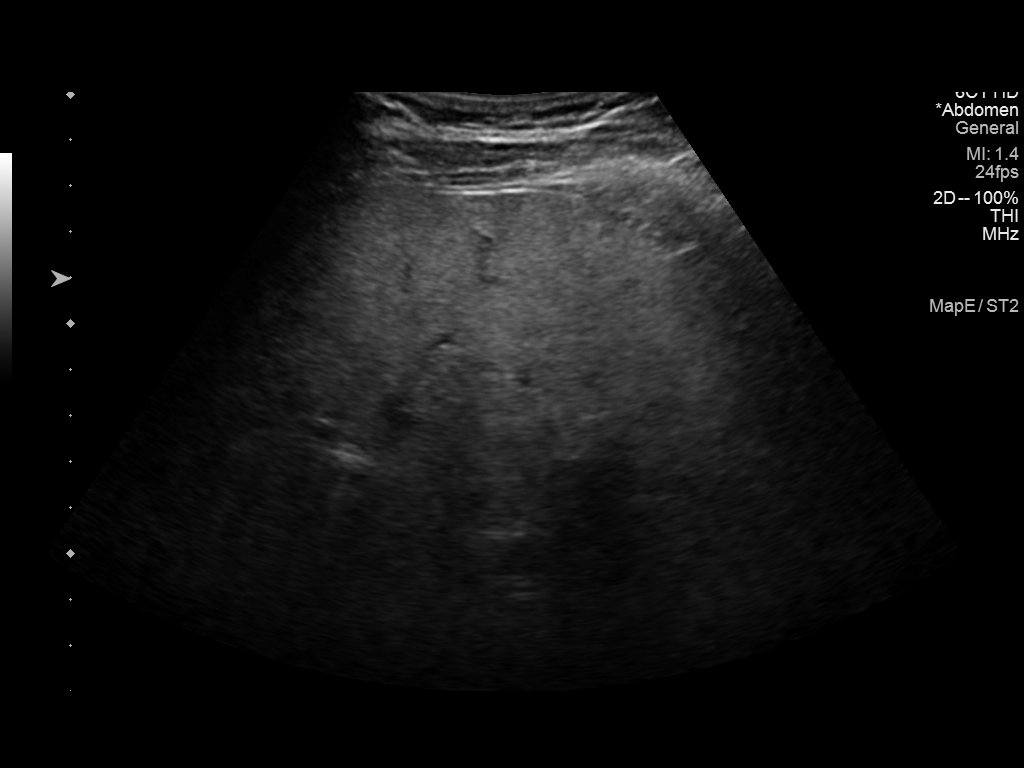

[4 of 4 positions shown; findings below may reference images not displayed]

EXAM:
ULTRASOUND-GUIDED RANDOM LIVER CORTEX BIOPSY.  CORE.

MEDICATIONS:
None.

ANESTHESIA/SEDATION:
Fentanyl 100 mcg IV; Versed 1.5 mg IV

Moderate Sedation Time:  14

The patient was continuously monitored during the procedure by the
interventional radiology nurse under my direct supervision.

FLUOROSCOPY TIME:  Fluoroscopy Time:  minutes  seconds ( mGy).

COMPLICATIONS:
None immediate.

PROCEDURE:
Informed written consent was obtained from the patient after a
thorough discussion of the procedural risks, benefits and
alternatives. All questions were addressed. Maximal Sterile Barrier
Technique was utilized including caps, mask, sterile gowns, sterile
gloves, sterile drape, hand hygiene and skin antiseptic. A timeout
was performed prior to the initiation of the procedure.

The right flank was prepped with ChloraPrep in a sterile fashion,
and a sterile drape was applied covering the operative field. A
sterile gown and sterile gloves were used for the procedure.

Under sonographic guidance, an 17 gauge guide needle was advanced
into the right lobe of the liver. Subsequently 3 18 gauge core
biopsies were obtained. The guide needle was removed. Final imaging
was performed.

Patient tolerated the procedure well without complication. Vital
sign monitoring by nursing staff during the procedure will continue
as patient is in the special procedures unit for post procedure
observation.
FINDINGS: The images document guide needle placement within the right lobe of
the liver. Post biopsy images demonstrate no hemorrhage.
IMPRESSION: Successful ultrasound-guided random liver core biopsy.

## 2017-09-13 ENCOUNTER — Other Ambulatory Visit: Payer: Self-pay | Admitting: Family Medicine

## 2017-10-16 ENCOUNTER — Ambulatory Visit: Payer: BC Managed Care – PPO | Admitting: Family Medicine

## 2017-10-19 ENCOUNTER — Other Ambulatory Visit: Payer: Self-pay | Admitting: *Deleted

## 2017-10-19 MED ORDER — METFORMIN HCL 1000 MG PO TABS: 1000.0000 mg | ORAL_TABLET | Freq: Two times a day (BID) | ORAL | 0 refills | Status: AC

## 2017-10-19 NOTE — Telephone Encounter (Signed)
Rx done. 

## 2017-11-02 ENCOUNTER — Other Ambulatory Visit: Payer: Self-pay | Admitting: Family Medicine

## 2017-11-02 ENCOUNTER — Other Ambulatory Visit: Payer: Self-pay | Admitting: *Deleted

## 2017-11-02 MED ORDER — GLIPIZIDE 10 MG PO TABS
ORAL_TABLET | ORAL | 0 refills | Status: AC
Start: 2017-11-02 — End: ?

## 2017-11-02 NOTE — Telephone Encounter (Signed)
Rx done. 

## 2017-11-29 ENCOUNTER — Other Ambulatory Visit: Payer: Self-pay | Admitting: Family Medicine

## 2017-12-01 ENCOUNTER — Other Ambulatory Visit: Payer: Self-pay | Admitting: Family Medicine

## 2017-12-22 ENCOUNTER — Other Ambulatory Visit: Payer: Self-pay | Admitting: Family Medicine

## 2018-01-06 ENCOUNTER — Other Ambulatory Visit: Payer: Self-pay | Admitting: Family Medicine

## 2018-01-12 ENCOUNTER — Other Ambulatory Visit: Payer: Self-pay | Admitting: Family Medicine

## 2018-01-14 ENCOUNTER — Other Ambulatory Visit: Payer: Self-pay | Admitting: Family Medicine

## 2018-01-28 IMAGING — US US ABDOMEN COMPLETE
1 series · 13 of 25 positions shown · non-contrast
Comparison: February 10, 2014

CLINICAL DATA: Elevated liver enzymes.  Left flank pain

EXAM:
ABDOMEN ULTRASOUND COMPLETE

[Series 1: us abdomen complete · 0.24mm/px · 13 of 107 slices shown]
[im 1/107]
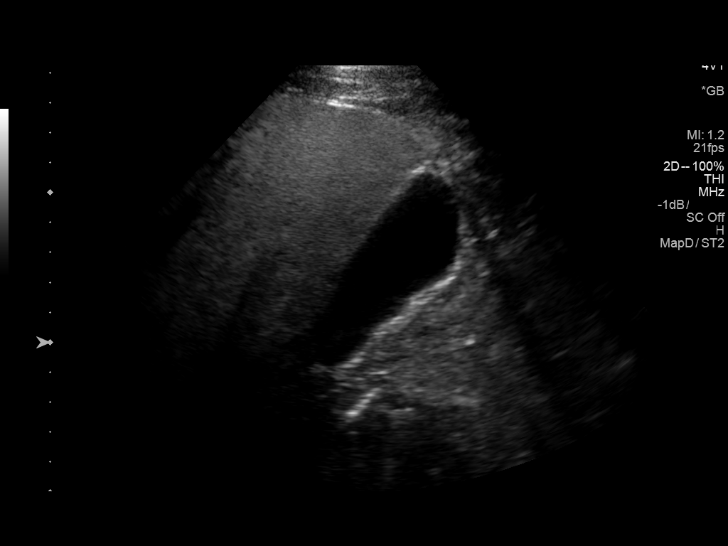
[im 9/107]
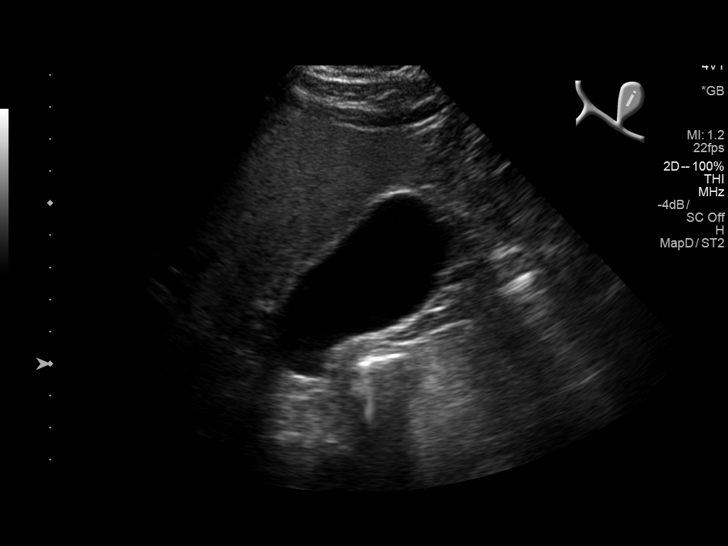
[im 18/107]
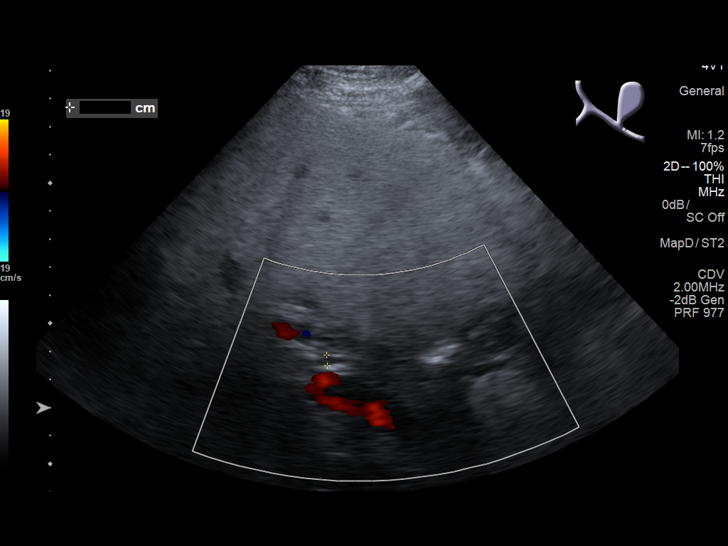
[im 27/107]
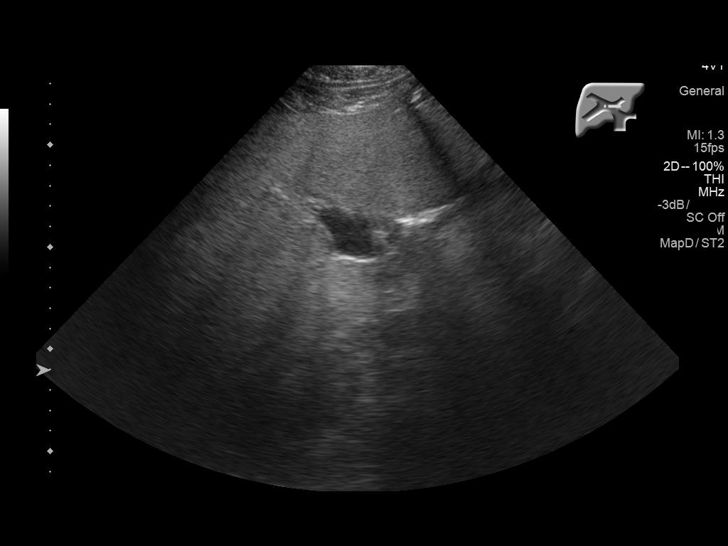
[im 36/107]
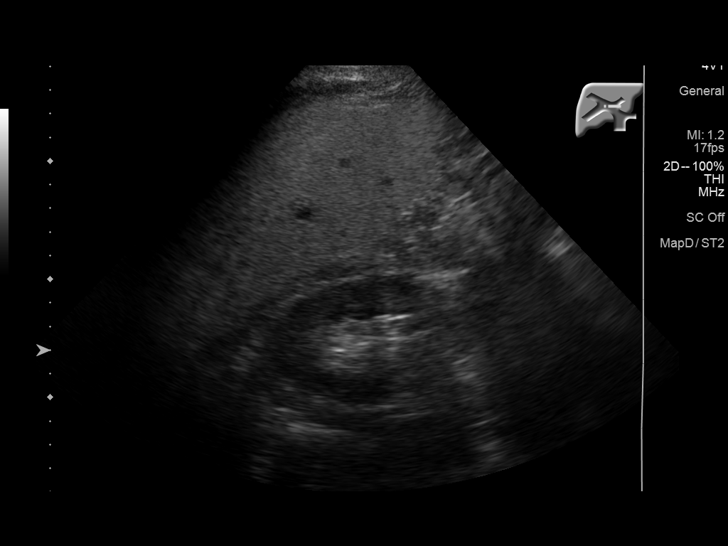
[im 45/107]
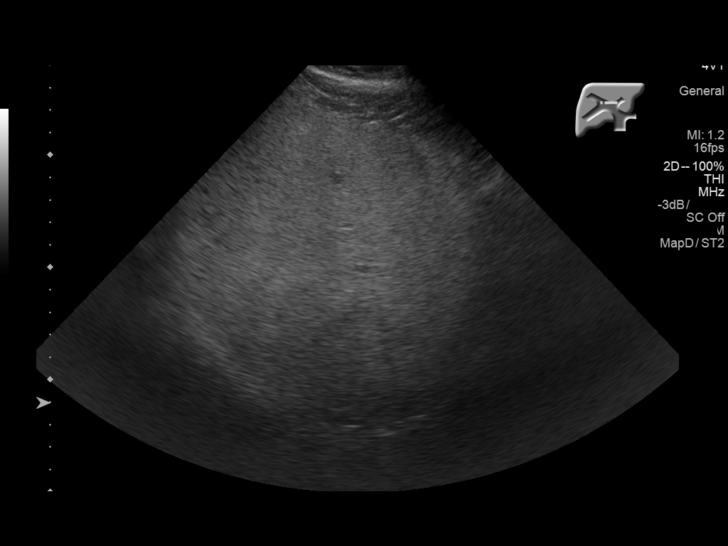
[im 54/107]
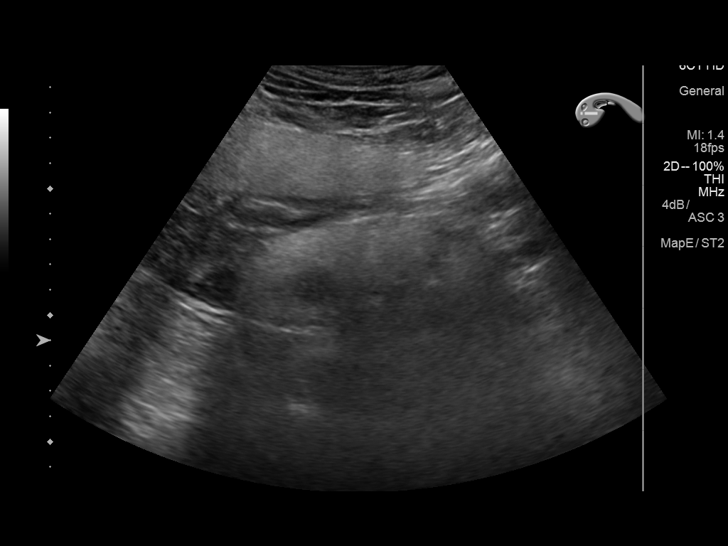
[im 62/107]
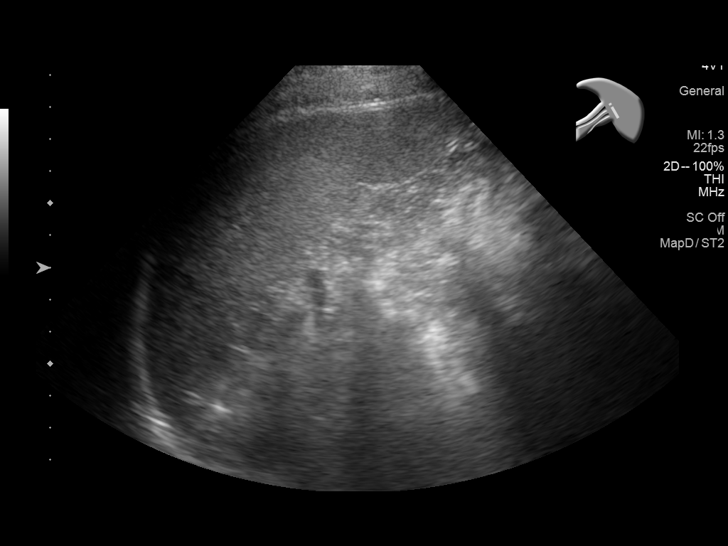
[im 71/107]
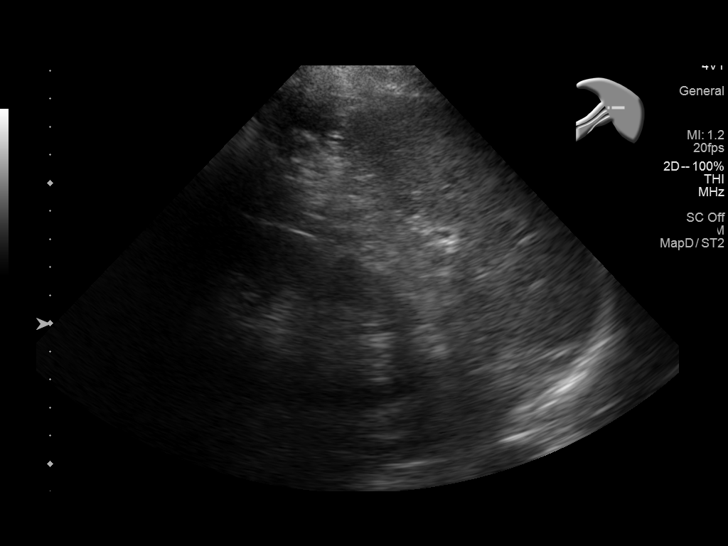
[im 80/107]
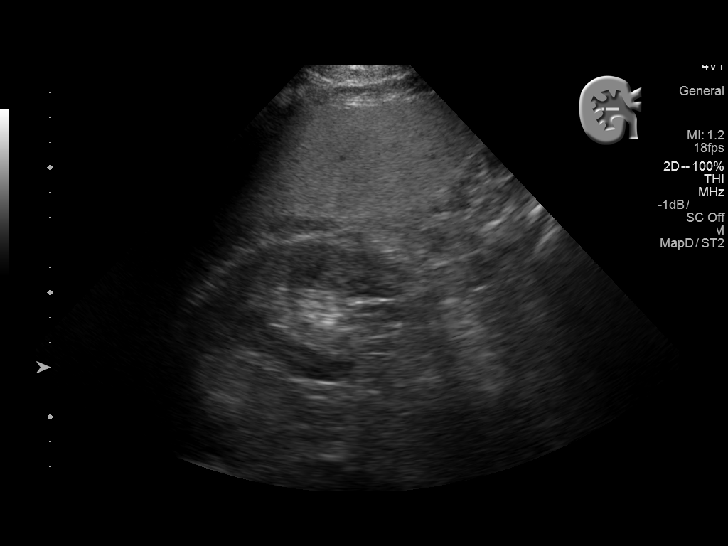
[im 89/107]
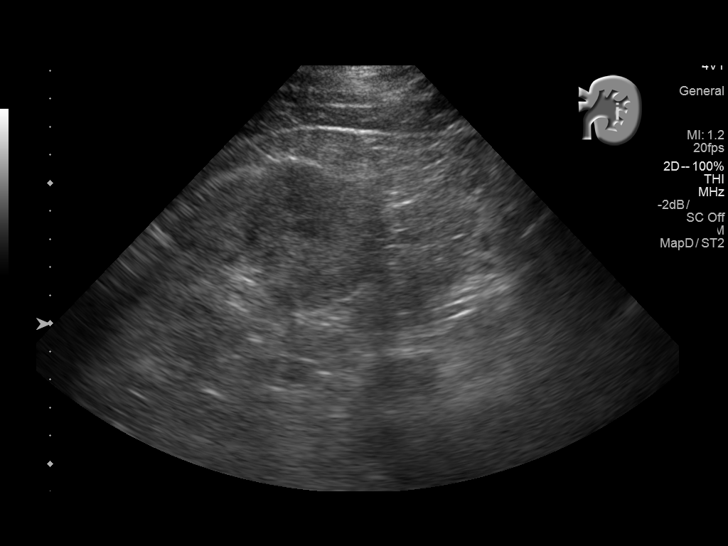
[im 98/107]
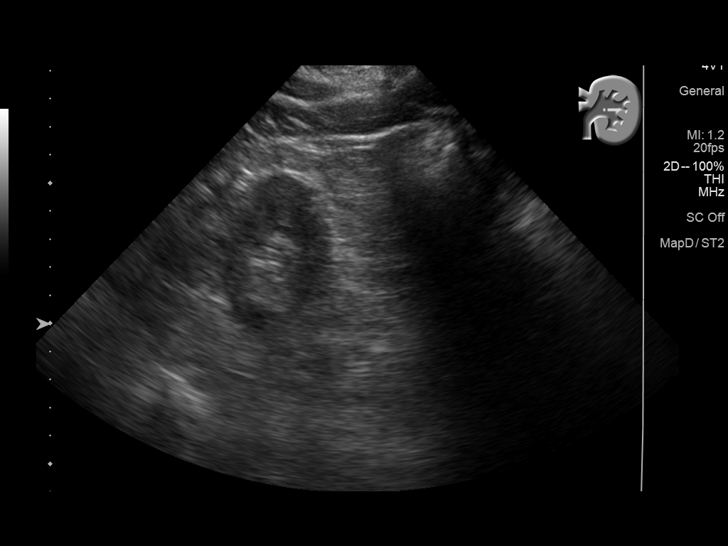
[im 107/107]
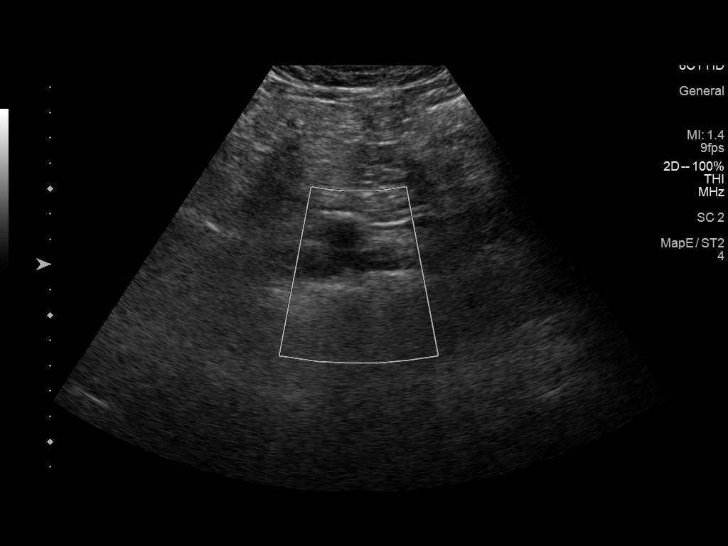

[13 of 25 positions shown; findings below may reference images not displayed]

FINDINGS: Gallbladder: No gallstones or wall thickening visualized. There is
no pericholecystic fluid. No sonographic Murphy sign noted by
sonographer.

Common bile duct: Diameter: 6 mm. There is no intrahepatic, common
hepatic, or common bile duct dilatation.

Liver: No focal lesion identified. Liver echogenicity is coarsened,
stable.

IVC: No abnormality visualized.

Pancreas: Visualized portion unremarkable. Portions of the
pancreatic tail are obscured by gas.

Spleen: Size and appearance within normal limits.

Right Kidney: Length: 13.0 cm. Echogenicity within normal limits. No
mass or hydronephrosis visualized.

Left Kidney: Length: 13.3 cm. Echogenicity within normal limits. No
mass or hydronephrosis visualized.

Abdominal aorta: No aneurysm visualized.

Other findings: No demonstrable ascites.
IMPRESSION: Liver echogenicity remains coarsened, likely due to underlying
parenchymal liver disease. While no focal liver lesions are
identified, it must be cautioned that the sensitivity of ultrasound
for focal liver lesions that circumstance is diminished.

Portions of pancreas obscured by gas. Visualized portions of
pancreas appear unremarkable. Study otherwise unremarkable.

## 2018-02-04 ENCOUNTER — Other Ambulatory Visit: Payer: Self-pay | Admitting: *Deleted

## 2018-02-04 ENCOUNTER — Other Ambulatory Visit: Payer: Self-pay | Admitting: Family Medicine

## 2018-02-04 MED ORDER — GLIPIZIDE 10 MG PO TABS: ORAL_TABLET | ORAL | 0 refills | Status: AC

## 2018-02-04 MED ORDER — PRAVASTATIN SODIUM 40 MG PO TABS: ORAL_TABLET | ORAL | 0 refills | Status: AC

## 2018-02-04 NOTE — Addendum Note (Signed)
Addended by: Johnella MoloneyFUNDERBURK, JO A on: 02/04/2018 03:48 PM   Modules accepted: Orders

## 2018-02-04 NOTE — Telephone Encounter (Signed)
Rx done. 

## 2018-02-06 ENCOUNTER — Other Ambulatory Visit: Payer: Self-pay | Admitting: Family Medicine
